# Patient Record
Sex: Female | Born: 1985
Health system: Southern US, Community
[De-identification: ages and names within clinical notes are randomized; demographics above are authoritative.]

## PROBLEM LIST (undated history)

## (undated) DIAGNOSIS — J302 Other seasonal allergic rhinitis: Secondary | ICD-10-CM

## (undated) DIAGNOSIS — F32A Depression, unspecified: Secondary | ICD-10-CM

## (undated) DIAGNOSIS — F329 Major depressive disorder, single episode, unspecified: Secondary | ICD-10-CM

## (undated) HISTORY — PX: PERINEAL LACERATION REPAIR: SHX5389

## (undated) HISTORY — DX: Other seasonal allergic rhinitis: J30.2

## (undated) HISTORY — DX: Depression, unspecified: F32.A

---
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
## 2006-06-04 HISTORY — PX: CHOLECYSTECTOMY: SHX55

## 2019-03-20 ENCOUNTER — Encounter: Payer: Self-pay | Admitting: Physician Assistant

## 2019-03-20 ENCOUNTER — Other Ambulatory Visit: Payer: Self-pay

## 2019-03-20 ENCOUNTER — Ambulatory Visit (INDEPENDENT_AMBULATORY_CARE_PROVIDER_SITE_OTHER): Payer: 59 | Admitting: Physician Assistant

## 2019-03-20 VITALS — BP 118/64 | HR 86 | Temp 98.2°F | Ht 64.0 in | Wt 166.0 lb

## 2019-03-20 DIAGNOSIS — F4323 Adjustment disorder with mixed anxiety and depressed mood: Secondary | ICD-10-CM

## 2019-03-20 NOTE — Patient Instructions (Signed)
It was great to meet you!  Start Zoloft 25 mg daily.   Let's follow-up in 2-4 weeks virtually, sooner if you have concerns.  Take care,  Inda Coke PA-C

## 2019-03-20 NOTE — Progress Notes (Signed)
Pamela Harper is a 33 y.o. female here for a new problem.  History of Present Illness:   Chief Complaint  Patient presents with  . Depression    HPI   Situational depression anxiety Patient reports that she has had intermittent issues in the past with anxiety and depression.  In 2013 she had a divorce and underwent therapy as well as took Prozac for about 6 months.  In 2016 she was on Celexa briefly for 6 months.  And then in 2017 she was on Zoloft for 2 months.  She has been off medication since 2017.  She had her first postpartum.  In May 2020, and since then she has noticed significant fluctuations in her mood.  She has worsening irritability, anxiety, and depressive thoughts the week before her period.  She states that with time she notices that the amount of time that she is having these thoughts is increasing.  And now states that there is about 2 weeks out of the month that she is having symptoms.  Denies suicidal or homicidal thoughts.  Her husband is a cardiologist, currently spending a lot of time at work, and she is having some issues with finding friends in the area.  She has 2 young children each 1 and 3.  She is condoms with her husband.  She is currently breast-feeding.  Depression screen PHQ 2/9 03/20/2019  Decreased Interest 1  Down, Depressed, Hopeless 2  PHQ - 2 Score 3  Altered sleeping 0  Tired, decreased energy 3  Change in appetite 2  Feeling bad or failure about yourself  2  Trouble concentrating 0  Moving slowly or fidgety/restless 0  Suicidal thoughts 0  PHQ-9 Score 10  Difficult doing work/chores Somewhat difficult     Past Medical History:  Diagnosis Date  . Depression   . Seasonal allergies      Social History   Socioeconomic History  . Marital status: Married    Spouse name: Not on file  . Number of children: Not on file  . Years of education: Not on file  . Highest education level: Not on file  Occupational History  . Not on file   Social Needs  . Financial resource strain: Not on file  . Food insecurity    Worry: Not on file    Inability: Not on file  . Transportation needs    Medical: Not on file    Non-medical: Not on file  Tobacco Use  . Smoking status: Never Smoker  . Smokeless tobacco: Never Used  Substance and Sexual Activity  . Alcohol use: Yes    Comment: Socially  . Drug use: Never  . Sexual activity: Not on file  Lifestyle  . Physical activity    Days per week: Not on file    Minutes per session: Not on file  . Stress: Not on file  Relationships  . Social Musician on phone: Not on file    Gets together: Not on file    Attends religious service: Not on file    Active member of club or organization: Not on file    Attends meetings of clubs or organizations: Not on file    Relationship status: Not on file  . Intimate partner violence    Fear of current or ex partner: Not on file    Emotionally abused: Not on file    Physically abused: Not on file    Forced sexual activity: Not on file  Other Topics Concern  . Not on file  Social History Narrative  . Not on file    Past Surgical History:  Procedure Laterality Date  . CESAREAN SECTION  2019  . CHOLECYSTECTOMY  2008    History reviewed. No pertinent family history.  Allergies  Allergen Reactions  . Codeine Hives    Current Medications:  No current outpatient medications on file.   Review of Systems:   ROS  Negative unless otherwise specified per HPI.   Vitals:   Vitals:   03/20/19 1445  BP: 118/64  Pulse: 86  Temp: 98.2 F (36.8 C)  SpO2: 99%  Weight: 166 lb (75.3 kg)  Height: 5\' 4"  (1.626 m)     Body mass index is 28.49 kg/m.  Physical Exam:   Physical Exam Vitals signs and nursing note reviewed.  Constitutional:      General: She is not in acute distress.    Appearance: She is well-developed. She is not ill-appearing or toxic-appearing.  Cardiovascular:     Rate and Rhythm: Normal rate and  regular rhythm.     Pulses: Normal pulses.     Heart sounds: Normal heart sounds, S1 normal and S2 normal.     Comments: No LE edema Pulmonary:     Effort: Pulmonary effort is normal.     Breath sounds: Normal breath sounds.  Skin:    General: Skin is warm and dry.  Neurological:     Mental Status: She is alert.     GCS: GCS eye subscore is 4. GCS verbal subscore is 5. GCS motor subscore is 6.  Psychiatric:        Attention and Perception: Attention normal.        Mood and Affect: Mood is anxious. Affect is tearful.        Speech: Speech normal.        Behavior: Behavior normal. Behavior is cooperative.     No results found for this or any previous visit.  Assessment and Plan:   [place] was seen today for depression.  Diagnoses and all orders for this visit:  Adjustment reaction with anxiety and depression Long discussion regarding this with patient.  We are going to start 25 mg Zoloft daily, due to breastfeeding status. Recommend that she tell her husband that she is starting this medication.  She does have an upcoming appointment with a new therapist in a few weeks.  Encouraged her to follow-up with me virtually in 2 to 4 weeks.  We will also check labs to rule out any organic cause of her worsening mood. I discussed with patient that if they develop any SI, to tell someone immediately and seek medical attention. -     CBC with Differential/Platelet -     Comprehensive metabolic panel -     TSH    . Reviewed expectations re: course of current medical issues. . Discussed self-management of symptoms. . Outlined signs and symptoms indicating need for more acute intervention. . Patient verbalized understanding and all questions were answered. . See orders for this visit as documented in the electronic medical record. . Patient received an After-Visit Summary.  I spent 45 minutes with this patient, greater than 50% was face-to-face time counseling regarding the above  diagnoses.   Inda Coke, PA-C

## 2019-03-20 NOTE — Addendum Note (Signed)
Addended by: Francis Dowse T on: 03/20/2019 03:57 PM   Modules accepted: Orders

## 2019-03-20 NOTE — Addendum Note (Signed)
Addended by: Francis Dowse T on: 03/20/2019 03:50 PM   Modules accepted: Orders

## 2019-03-20 NOTE — Addendum Note (Signed)
Addended by: Francis Dowse T on: 03/20/2019 03:56 PM   Modules accepted: Orders

## 2019-03-20 NOTE — Addendum Note (Signed)
Addended by: Francis Dowse T on: 03/20/2019 03:51 PM   Modules accepted: Orders

## 2019-03-24 ENCOUNTER — Other Ambulatory Visit: Payer: 59

## 2019-03-26 ENCOUNTER — Other Ambulatory Visit: Payer: Self-pay | Admitting: *Deleted

## 2019-03-26 ENCOUNTER — Encounter: Payer: Self-pay | Admitting: Physician Assistant

## 2019-03-26 MED ORDER — SERTRALINE HCL 25 MG PO TABS: 25.0000 mg | ORAL_TABLET | Freq: Every day | ORAL | 0 refills | Status: DC

## 2019-04-29 DIAGNOSIS — F4323 Adjustment disorder with mixed anxiety and depressed mood: Secondary | ICD-10-CM | POA: Diagnosis not present

## 2019-05-07 DIAGNOSIS — F4323 Adjustment disorder with mixed anxiety and depressed mood: Secondary | ICD-10-CM | POA: Diagnosis not present

## 2019-05-19 DIAGNOSIS — F4323 Adjustment disorder with mixed anxiety and depressed mood: Secondary | ICD-10-CM | POA: Diagnosis not present

## 2019-06-09 DIAGNOSIS — F4323 Adjustment disorder with mixed anxiety and depressed mood: Secondary | ICD-10-CM | POA: Diagnosis not present

## 2019-06-23 DIAGNOSIS — F4323 Adjustment disorder with mixed anxiety and depressed mood: Secondary | ICD-10-CM | POA: Diagnosis not present

## 2019-07-07 DIAGNOSIS — F4323 Adjustment disorder with mixed anxiety and depressed mood: Secondary | ICD-10-CM | POA: Diagnosis not present

## 2019-07-21 DIAGNOSIS — F4323 Adjustment disorder with mixed anxiety and depressed mood: Secondary | ICD-10-CM | POA: Diagnosis not present

## 2019-08-05 DIAGNOSIS — F4323 Adjustment disorder with mixed anxiety and depressed mood: Secondary | ICD-10-CM | POA: Diagnosis not present

## 2019-09-01 DIAGNOSIS — F4323 Adjustment disorder with mixed anxiety and depressed mood: Secondary | ICD-10-CM | POA: Diagnosis not present

## 2019-09-09 DIAGNOSIS — Z23 Encounter for immunization: Secondary | ICD-10-CM | POA: Diagnosis not present

## 2019-09-18 ENCOUNTER — Encounter: Payer: Self-pay | Admitting: Family Medicine

## 2019-09-18 ENCOUNTER — Ambulatory Visit (INDEPENDENT_AMBULATORY_CARE_PROVIDER_SITE_OTHER): Payer: 59 | Admitting: Family Medicine

## 2019-09-18 ENCOUNTER — Other Ambulatory Visit (HOSPITAL_COMMUNITY)
Admission: RE | Admit: 2019-09-18 | Discharge: 2019-09-18 | Disposition: A | Discharge status: Home / Self Care | Payer: 59 | Source: Ambulatory Visit | Attending: Family Medicine | Admitting: Family Medicine

## 2019-09-18 ENCOUNTER — Other Ambulatory Visit: Payer: Self-pay

## 2019-09-18 VITALS — BP 110/70 | HR 87 | Temp 98.4°F | Ht 64.0 in | Wt 172.6 lb

## 2019-09-18 DIAGNOSIS — R350 Frequency of micturition: Secondary | ICD-10-CM | POA: Diagnosis not present

## 2019-09-18 LAB — POC URINALSYSI DIPSTICK (AUTOMATED)
Bilirubin, UA: NEGATIVE
Blood, UA: NEGATIVE
Glucose, UA: NEGATIVE
Ketones, UA: NEGATIVE
Leukocytes, UA: NEGATIVE
Nitrite, UA: NEGATIVE
Protein, UA: NEGATIVE
Spec Grav, UA: 1.01 (ref 1.010–1.025)
Urobilinogen, UA: 0.2 E.U./dL
pH, UA: 6 (ref 5.0–8.0)

## 2019-09-18 LAB — POCT URINE PREGNANCY: Preg Test, Ur: NEGATIVE

## 2019-09-18 MED ORDER — CEPHALEXIN 500 MG PO CAPS: 500.0000 mg | ORAL_CAPSULE | Freq: Two times a day (BID) | ORAL | 0 refills | Status: AC

## 2019-09-18 NOTE — Patient Instructions (Addendum)
It was very nice to see you today!  We will check a urine culture and a swab to make sure that you do not have any signs of infection.  It is possible that your symptoms could be related to your previous pelvic floor issues.  Please let us know if your symptoms change or if you start to develop any sort of fevers or chills.  Please start the antibiotics if you develop any signs of a kidney infection.  Please make sure that you get plenty of fluids and stay well-hydrated.  Take care, Dr Jimmey Ralph  Please try these tips to maintain a healthy lifestyle:   Eat at least 3 REAL meals and 1-2 snacks per day.  Aim for no more than 5 hours between eating.  If you eat breakfast, please do so within one hour of getting up.    Each meal should contain half fruits/vegetables, one quarter protein, and one quarter carbs (no bigger than a computer mouse)   Cut down on sweet beverages. This includes juice, soda, and sweet tea.     Drink at least 1 glass of water with each meal and aim for at least 8 glasses per day   Exercise at least 150 minutes every week.

## 2019-09-18 NOTE — Progress Notes (Signed)
   Pamela Harper is a 34 y.o. female who presents today for an office visit.  Assessment/Plan:  New/Acute Problems: Urinary Urgency UA negative.  Urine pregnancy negative normal-appearing gynecologic exam.  Urine culture and GC/CT/BV/Candida test pending.  Has a history of pyelonephritis in the past.  In light of this we will send in "pocket prescription" for Keflex to not start unless symptoms worsen over the weekend.  Encourage good oral hydration.  If above tests are negative and symptoms persist will likely need ultrasound to rule out fibroids, ovarian cyst, etc.  Discussed reasons to return to care.      Subjective:  HPI:  Patient here with lower abdominal pain for the past week or so.  Has had more discomfort more bloating.  She has increased urinary urgency.  No dysuria.  Some tenderness with pressing on the area.  No fevers.  No chills.  No back pain.  No vaginal discharge.  No bloody urine.  Took 2 home pregnancy tests which were negative.  No issues with constipation.       Objective:  Physical Exam: There were no vitals taken for this visit.  Gen: No acute distress, resting comfortably CV: Regular rate and rhythm with no murmurs appreciated Pulm: Normal work of breathing, clear to auscultation bilaterally with no crackles, wheezes, or rhonchi GU: Normal internal and external female genitalia.  Cervix observed without abnormality.  Bimanual exam with no significant abnormalities aside from mild tenderness along left adnexal area and suprapubic area. Neuro: Grossly normal, moves all extremities Psych: Normal affect and thought content  Time Spent: 41 minutes of total time was spent on the date of the encounter performing the following actions: chart review prior to seeing the patient, obtaining history, performing a medically necessary exam, counseling on the treatment plan, placing orders, and documenting in our EHR.        Katina Degree. Jimmey Ralph, MD 09/18/2019 3:12 PM

## 2019-09-19 LAB — URINE CULTURE
MICRO NUMBER:: 10373120
SPECIMEN QUALITY:: ADEQUATE

## 2019-09-21 NOTE — Progress Notes (Signed)
Please inform patient of the following:  Urine culture shows no sign of UTI. We are still waiting on her other swab to come back, though I expect for it to be normal. Would like for her to let us know if she is still having symptoms and if so we will need to get an ultrasound.

## 2019-09-22 LAB — CERVICOVAGINAL ANCILLARY ONLY
Bacterial Vaginitis (gardnerella): NEGATIVE
Candida Glabrata: NEGATIVE
Candida Vaginitis: NEGATIVE
Chlamydia: NEGATIVE
Comment: NEGATIVE
Comment: NEGATIVE
Comment: NEGATIVE
Comment: NEGATIVE
Comment: NORMAL
Neisseria Gonorrhea: NEGATIVE

## 2019-09-22 NOTE — Progress Notes (Signed)
Please inform patient of the following:  Swab negative for infection. Would like for her to let us know if symptoms are still persisting and we can order an ultrasound.  Katina Degree. Jimmey Ralph, MD 09/22/2019 1:00 PM

## 2019-09-24 ENCOUNTER — Other Ambulatory Visit: Payer: Self-pay | Admitting: *Deleted

## 2019-09-24 DIAGNOSIS — R350 Frequency of micturition: Secondary | ICD-10-CM

## 2019-09-24 DIAGNOSIS — R102 Pelvic and perineal pain: Secondary | ICD-10-CM

## 2019-09-28 DIAGNOSIS — F4323 Adjustment disorder with mixed anxiety and depressed mood: Secondary | ICD-10-CM | POA: Diagnosis not present

## 2019-10-02 ENCOUNTER — Ambulatory Visit
Admission: RE | Admit: 2019-10-02 | Discharge: 2019-10-02 | Disposition: A | Discharge status: Home / Self Care | Payer: 59 | Source: Ambulatory Visit | Attending: Family Medicine | Admitting: Family Medicine

## 2019-10-02 DIAGNOSIS — R102 Pelvic and perineal pain: Secondary | ICD-10-CM | POA: Diagnosis not present

## 2019-10-05 NOTE — Progress Notes (Signed)
Please inform patient of the following:  Her ultrasound showed no fibroids or other significant abnormalities. It is possible that her scar from her c section could be causing her symptoms. Recommend referral to GYN or pelvic floor rehab if she is still having symptoms.  Pamela Harper. Jimmey Ralph, MD 10/05/2019 12:47 PM

## 2019-11-23 DIAGNOSIS — H5203 Hypermetropia, bilateral: Secondary | ICD-10-CM | POA: Diagnosis not present

## 2019-11-24 ENCOUNTER — Ambulatory Visit (INDEPENDENT_AMBULATORY_CARE_PROVIDER_SITE_OTHER): Payer: 59 | Admitting: Physician Assistant

## 2019-11-24 ENCOUNTER — Encounter: Payer: Self-pay | Admitting: Physician Assistant

## 2019-11-24 ENCOUNTER — Other Ambulatory Visit: Payer: Self-pay

## 2019-11-24 VITALS — BP 110/68 | HR 68 | Temp 98.0°F | Ht 64.0 in | Wt 167.6 lb

## 2019-11-24 DIAGNOSIS — R5383 Other fatigue: Secondary | ICD-10-CM | POA: Diagnosis not present

## 2019-11-24 DIAGNOSIS — F4323 Adjustment disorder with mixed anxiety and depressed mood: Secondary | ICD-10-CM | POA: Diagnosis not present

## 2019-11-24 DIAGNOSIS — Z13220 Encounter for screening for lipoid disorders: Secondary | ICD-10-CM | POA: Diagnosis not present

## 2019-11-24 DIAGNOSIS — E663 Overweight: Secondary | ICD-10-CM | POA: Diagnosis not present

## 2019-11-24 DIAGNOSIS — Z136 Encounter for screening for cardiovascular disorders: Secondary | ICD-10-CM

## 2019-11-24 DIAGNOSIS — Z0001 Encounter for general adult medical examination with abnormal findings: Secondary | ICD-10-CM

## 2019-11-24 LAB — COMPREHENSIVE METABOLIC PANEL
ALT: 16 U/L (ref 0–35)
AST: 16 U/L (ref 0–37)
Albumin: 5.2 g/dL (ref 3.5–5.2)
Alkaline Phosphatase: 59 U/L (ref 39–117)
BUN: 16 mg/dL (ref 6–23)
CO2: 27 mEq/L (ref 19–32)
Calcium: 9.7 mg/dL (ref 8.4–10.5)
Chloride: 102 mEq/L (ref 96–112)
Creatinine, Ser: 0.85 mg/dL (ref 0.40–1.20)
GFR: 75.99 mL/min (ref >60.00)
Glucose, Bld: 94 mg/dL (ref 70–99)
Potassium: 4.3 mEq/L (ref 3.5–5.1)
Sodium: 136 mEq/L (ref 135–145)
Total Bilirubin: 0.6 mg/dL (ref 0.2–1.2)
Total Protein: 8.2 g/dL (ref 6.0–8.3)

## 2019-11-24 LAB — LIPID PANEL
Cholesterol: 138 mg/dL (ref 0–200)
HDL: 59.6 mg/dL (ref >39.00)
LDL Cholesterol: 71 mg/dL (ref 0–99)
NonHDL: 78.5
Total CHOL/HDL Ratio: 2
Triglycerides: 36 mg/dL (ref 0.0–149.0)
VLDL: 7.2 mg/dL (ref 0.0–40.0)

## 2019-11-24 LAB — CBC WITH DIFFERENTIAL/PLATELET
Basophils Absolute: 0 10*3/uL (ref 0.0–0.1)
Basophils Relative: 0.5 % (ref 0.0–3.0)
Eosinophils Absolute: 0.2 10*3/uL (ref 0.0–0.7)
Eosinophils Relative: 2.1 % (ref 0.0–5.0)
HCT: 41 % (ref 36.0–46.0)
Hemoglobin: 13.7 g/dL (ref 12.0–15.0)
Lymphocytes Relative: 40.6 % (ref 12.0–46.0)
Lymphs Abs: 3.5 10*3/uL (ref 0.7–4.0)
MCHC: 33.4 g/dL (ref 30.0–36.0)
MCV: 81.1 fl (ref 78.0–100.0)
Monocytes Absolute: 0.4 10*3/uL (ref 0.1–1.0)
Monocytes Relative: 4.2 % (ref 3.0–12.0)
Neutro Abs: 4.5 10*3/uL (ref 1.4–7.7)
Neutrophils Relative %: 52.6 % (ref 43.0–77.0)
Platelets: 294 10*3/uL (ref 150.0–400.0)
RBC: 5.05 Mil/uL (ref 3.87–5.11)
RDW: 13.1 % (ref 11.5–15.5)
WBC: 8.5 10*3/uL (ref 4.0–10.5)

## 2019-11-24 LAB — VITAMIN D 25 HYDROXY (VIT D DEFICIENCY, FRACTURES): VITD: 37.84 ng/mL (ref 30.00–100.00)

## 2019-11-24 LAB — VITAMIN B12: Vitamin B-12: 409 pg/mL (ref 211–911)

## 2019-11-24 LAB — TSH: TSH: 1.63 u[IU]/mL (ref 0.35–4.50)

## 2019-11-24 NOTE — Progress Notes (Signed)
I acted as a Neurosurgeon for [name], PA-C [place], [place]  Subjective:    Pamela Harper is a 34 y.o. female and is here for a comprehensive physical exam.  HPI   Patient is fasting. No new concerns.  Health Maintenance Due  Topic Date Due  . Hepatitis C Screening  Never done  . HIV Screening  Never done    Acute Concerns: Fatigue -- drinking 1-3 cups daily, working on getting back into activity with her Pel[employer] bike. Sleep is adequate but sometimes she admits to staying up too late. Has good nutrition overall. Denies significant weight gain/loss. Breastfeeding once/day. Has two young children.   Chronic Issues: Adjustment reaction with anxiety/depression -- never started zoloft. Doing well with therapy.   Health Maintenance: Immunizations -- Up to date Colonoscopy -- N/A Mammogram --N/A PAP -- Up to date. Last done 10/2018 Bone Density -- N/A Diet -- overall health Caffeine intake -- coffee -- 1-3 cups daily Sleep habits -- sometimes staying awake for "me time" Exercise -- using Pel[employer] Current Weight -- Weight: 167 lb 9.6 oz (76 kg) ; would like to be closer to 150 lb Weight History: Wt Readings from Last 10 Encounters:  11/24/19 167 lb 9.6 oz (76 kg)  09/18/19 172 lb 9.6 oz (78.3 kg)  03/20/19 166 lb (75.3 kg)   Body mass index is 28.77 kg/m. Mood -- overall good Patient's last menstrual period was 10/28/2019 (exact date). Alcohol -- 0-2 drinks/week  Depression screen PHQ 2/9 03/20/2019  Decreased Interest 1  Down, Depressed, Hopeless 2  PHQ - 2 Score 3  Altered sleeping 0  Tired, decreased energy 3  Change in appetite 2  Feeling bad or failure about yourself  2  Trouble concentrating 0  Moving slowly or fidgety/restless 0  Suicidal thoughts 0  PHQ-9 Score 10  Difficult doing work/chores Somewhat difficult   Eye doctor [facility] Dentist pending  Other providers/specialists: Patient Care Team: [name], [place] as PCP - General (Physician  Assistant)   PMHx, SurgHx, SocialHx, Medications, and Allergies were reviewed in the Visit Navigator and updated as appropriate.   Past Medical History:  Diagnosis Date  . Depression   . Seasonal allergies      Past Surgical History:  Procedure Laterality Date  . CESAREAN SECTION  2019  . CHOLECYSTECTOMY  2008     Family History  Problem Relation Age of Onset  . Diabetes Mother        prediabetes  . Sleep apnea Mother   . Bipolar disorder Mother   . Anxiety disorder Mother   . Depression Mother   . Asthma Mother   . Macular degeneration Mother   . Hypothyroidism Mother   . Hypertension Father   . Multiple sclerosis Father   . Depression Father   . Sleep apnea Father   . Hyperthyroidism Father   . Anxiety disorder Sister   . Macular degeneration Maternal Grandfather   . Diabetes Paternal Grandmother   . Hyperlipidemia Paternal Grandmother   . Hypertension Paternal Grandmother   . Cancer Paternal Grandfather     Social History   Tobacco Use  . Smoking status: Never Smoker  . Smokeless tobacco: Never Used  Substance Use Topics  . Alcohol use: Yes    Comment: Socially  . Drug use: Never    Review of Systems:   Review of Systems  Constitutional: Negative.  Negative for chills, fever, malaise/fatigue and weight loss.  HENT: Negative.  Negative for hearing loss,  sinus pain and sore throat.   Eyes: Negative.   Respiratory: Negative.  Negative for cough and hemoptysis.   Cardiovascular: Negative.  Negative for chest pain, palpitations, leg swelling and PND.  Gastrointestinal: Negative.  Negative for abdominal pain, constipation, diarrhea, heartburn, nausea and vomiting.  Genitourinary: Negative.  Negative for dysuria, frequency and urgency.  Musculoskeletal: Negative.  Negative for back pain, myalgias and neck pain.  Skin: Negative.  Negative for itching and rash.  Neurological: Negative.  Negative for dizziness, tingling, seizures and headaches.    Endo/Heme/Allergies: Negative for polydipsia.  Psychiatric/Behavioral: Negative.  Negative for depression. The patient is not nervous/anxious.     Objective:   BP 110/68 (BP Location: Right Arm, Patient Position: Sitting, Cuff Size: Normal)   Pulse 68   Temp 98 F (36.7 C) (Temporal)   Ht 5\' 4"  (1.626 m)   Wt 167 lb 9.6 oz (76 kg)   LMP 10/28/2019 (Exact Date)   SpO2 99%   Breastfeeding Yes   BMI 28.77 kg/m   General Appearance:    Alert, cooperative, no distress, appears stated age  Head:    Normocephalic, without obvious abnormality, atraumatic  Eyes:    PERRL, conjunctiva/corneas clear, EOM's intact, fundi    benign, both eyes  Ears:    Normal TM's and external ear canals, both ears  Nose:   Nares normal, septum midline, mucosa normal, no drainage    or sinus tenderness  Throat:   Lips, mucosa, and tongue normal; teeth and gums normal  Neck:   Supple, symmetrical, trachea midline, no adenopathy;    thyroid:  no enlargement/tenderness/nodules; no carotid   bruit or JVD  Back:     Symmetric, no curvature, ROM normal, no CVA tenderness  Lungs:     Clear to auscultation bilaterally, respirations unlabored  Chest Wall:    No tenderness or deformity   Heart:    Regular rate and rhythm, S1 and S2 normal, no murmur, rub   or gallop  Breast Exam:    No tenderness, masses, or nipple abnormality  Abdomen:     Soft, non-tender, bowel sounds active all four quadrants,    no masses, no organomegaly  Genitalia:    Normal female without lesion, discharge or tenderness  Rectal:    Normal tone no masses or tenderness  Extremities:   Extremities normal, atraumatic, no cyanosis or edema  Pulses:   2+ and symmetric all extremities  Skin:   Skin color, texture, turgor normal, no rashes or lesions  Lymph nodes:   Cervical, supraclavicular, and axillary nodes normal  Neurologic:   CNII-XII intact, normal strength, sensation and reflexes    throughout    Assessment/Plan:   Pamela Harper was  seen today for annual exam.  Diagnoses and all orders for this visit:  Encounter for general adult medical examination with abnormal findings Today patient counseled on age appropriate routine health concerns for screening and prevention, each reviewed and up to date or declined. Immunizations reviewed and up to date or declined. Labs ordered and reviewed. Risk factors for depression reviewed and negative. Hearing function and visual acuity are intact. ADLs screened and addressed as needed. Functional ability and level of safety reviewed and appropriate. Education, counseling and referrals performed based on assessed risks today. Patient provided with a copy of personalized plan for preventive services.  Fatigue, unspecified type Continue to work on healthy lifestyle and more time for herself as able. Will update labs to assess for any organic cause of symptoms. Recommendations based  upon results and clinical symptoms. -     Comprehensive metabolic panel -     TSH -     CBC with Differential/Platelet -     Vitamin B12 -     VITAMIN D 25 Hydroxy (Vit-D Deficiency, Fractures)  Overweight Continue exercise and healthy eating.  Adjustment reaction with anxiety and depression Doing well, continue to monitor.  Encounter for lipid screening for cardiovascular disease -     Lipid panel    Well Adult Exam: Labs ordered: Yes. Patient counseling was done. See below for items discussed. Discussed the patient's BMI. The BMI is not in the acceptable range; BMI management plan is completed Follow up in one year.  Patient Counseling:   [x]     Nutrition: Stressed importance of moderation in sodium/caffeine intake, saturated fat and cholesterol, caloric balance, sufficient intake of fresh fruits, vegetables, fiber, calcium, iron, and 1 mg of folate supplement per day (for females capable of pregnancy).   [x]      Stressed the importance of regular exercise.    [x]     Substance Abuse: Discussed  cessation/primary prevention of tobacco, alcohol, or other drug use; driving or other dangerous activities under the influence; availability of treatment for abuse.    [x]      Injury prevention: Discussed safety belts, safety helmets, smoke detector, smoking near bedding or upholstery.    [x]      Sexuality: Discussed sexually transmitted diseases, partner selection, use of condoms, avoidance of unintended pregnancy  and contraceptive alternatives.    [x]     Dental health: Discussed importance of regular tooth brushing, flossing, and dental visits.   [x]      Health maintenance and immunizations reviewed. Please refer to Health maintenance section.   CMA or LPN served as scribe during this visit. History, Physical, and Plan performed by medical provider. The above documentation has been reviewed and is accurate and complete.  Inda Coke, PA-C [place]

## 2019-11-24 NOTE — Patient Instructions (Signed)
It was great to see you!  Please go to the lab for blood work.   Our office will call you with your results unless you have chosen to receive results via MyChart.  If your blood work is normal we will follow-up each year for physicals and as scheduled for chronic medical problems.  If anything is abnormal we will treat accordingly and get you in for a follow-up.  Take care,  Pamela Harper    Health Maintenance, Female Adopting a healthy lifestyle and getting preventive care are important in promoting health and wellness. Ask your health care provider about:  The right schedule for you to have regular tests and exams.  Things you can do on your own to prevent diseases and keep yourself healthy. What should I know about diet, weight, and exercise? Eat a healthy diet   Eat a diet that includes plenty of vegetables, fruits, low-fat dairy products, and lean protein.  Do not eat a lot of foods that are high in solid fats, added sugars, or sodium. Maintain a healthy weight Body mass index (BMI) is used to identify weight problems. It estimates body fat based on height and weight. Your health care provider can help determine your BMI and help you achieve or maintain a healthy weight. Get regular exercise Get regular exercise. This is one of the most important things you can do for your health. Most adults should:  Exercise for at least 150 minutes each week. The exercise should increase your heart rate and make you sweat (moderate-intensity exercise).  Do strengthening exercises at least twice a week. This is in addition to the moderate-intensity exercise.  Spend less time sitting. Even light physical activity can be beneficial. Watch cholesterol and blood lipids Have your blood tested for lipids and cholesterol at 35 years of age, then have this test every 5 years. Have your cholesterol levels checked more often if:  Your lipid or cholesterol levels are high.  You are older than 35  years of age.  You are at high risk for heart disease. What should I know about cancer screening? Depending on your health history and family history, you may need to have cancer screening at various ages. This may include screening for:  Breast cancer.  Cervical cancer.  Colorectal cancer.  Skin cancer.  Lung cancer. What should I know about heart disease, diabetes, and high blood pressure? Blood pressure and heart disease  High blood pressure causes heart disease and increases the risk of stroke. This is more likely to develop in people who have high blood pressure readings, are of African descent, or are overweight.  Have your blood pressure checked: ? Every 3-5 years if you are 18-39 years of age. ? Every year if you are 40 years old or older. Diabetes Have regular diabetes screenings. This checks your fasting blood sugar level. Have the screening done:  Once every three years after age 40 if you are at a normal weight and have a low risk for diabetes.  More often and at a younger age if you are overweight or have a high risk for diabetes. What should I know about preventing infection? Hepatitis B If you have a higher risk for hepatitis B, you should be screened for this virus. Talk with your health care provider to find out if you are at risk for hepatitis B infection. Hepatitis C Testing is recommended for:  Everyone born from 1945 through 1965.  Anyone with known risk factors for hepatitis C. Sexually   transmitted infections (STIs)  Get screened for STIs, including gonorrhea and chlamydia, if: ? You are sexually active and are younger than 35 years of age. ? You are older than 35 years of age and your health care provider tells you that you are at risk for this type of infection. ? Your sexual activity has changed since you were last screened, and you are at increased risk for chlamydia or gonorrhea. Ask your health care provider if you are at risk.  Ask your health  care provider about whether you are at high risk for HIV. Your health care provider may recommend a prescription medicine to help prevent HIV infection. If you choose to take medicine to prevent HIV, you should first get tested for HIV. You should then be tested every 3 months for as long as you are taking the medicine. Pregnancy  If you are about to stop having your period (premenopausal) and you may become pregnant, seek counseling before you get pregnant.  Take 400 to 800 micrograms (mcg) of folic acid every day if you become pregnant.  Ask for birth control (contraception) if you want to prevent pregnancy. Osteoporosis and menopause Osteoporosis is a disease in which the bones lose minerals and strength with aging. This can result in bone fractures. If you are 65 years old or older, or if you are at risk for osteoporosis and fractures, ask your health care provider if you should:  Be screened for bone loss.  Take a calcium or vitamin D supplement to lower your risk of fractures.  Be given hormone replacement therapy (HRT) to treat symptoms of menopause. Follow these instructions at home: Lifestyle  Do not use any products that contain nicotine or tobacco, such as cigarettes, e-cigarettes, and chewing tobacco. If you need help quitting, ask your health care provider.  Do not use street drugs.  Do not share needles.  Ask your health care provider for help if you need support or information about quitting drugs. Alcohol use  Do not drink alcohol if: ? Your health care provider tells you not to drink. ? You are pregnant, may be pregnant, or are planning to become pregnant.  If you drink alcohol: ? Limit how much you use to 0-1 drink a day. ? Limit intake if you are breastfeeding.  Be aware of how much alcohol is in your drink. In the [place] one drink equals one 12 oz bottle of beer (355 mL), one 5 oz glass of wine (148 mL), or one 1 oz glass of hard liquor (44 mL). General  instructions  Schedule regular health, dental, and eye exams.  Stay current with your vaccines.  Tell your health care provider if: ? You often feel depressed. ? You have ever been abused or do not feel safe at home. Summary  Adopting a healthy lifestyle and getting preventive care are important in promoting health and wellness.  Follow your health care provider's instructions about healthy diet, exercising, and getting tested or screened for diseases.  Follow your health care provider's instructions on monitoring your cholesterol and blood pressure. This information is not intended to replace advice given to you by your health care provider. Make sure you discuss any questions you have with your health care provider. Document Revised: 05/14/2018 Document Reviewed: 05/14/2018 Elsevier Patient Education  2020 Elsevier Inc.  

## 2020-01-25 ENCOUNTER — Ambulatory Visit: Payer: 59 | Admitting: Physician Assistant

## 2020-01-25 ENCOUNTER — Encounter: Payer: Self-pay | Admitting: Physician Assistant

## 2020-01-25 ENCOUNTER — Other Ambulatory Visit: Payer: Self-pay

## 2020-01-25 VITALS — BP 110/80 | HR 84 | Temp 98.1°F | Ht 64.0 in | Wt 165.0 lb

## 2020-01-25 DIAGNOSIS — F4323 Adjustment disorder with mixed anxiety and depressed mood: Secondary | ICD-10-CM | POA: Diagnosis not present

## 2020-01-25 MED ORDER — ONDANSETRON HCL 4 MG PO TABS: 4.0000 mg | ORAL_TABLET | Freq: Three times a day (TID) | ORAL | 1 refills | Status: DC | PRN

## 2020-01-25 NOTE — Patient Instructions (Addendum)
It was great to see you!  Start Trintellix 5 mg daily (this is HALF) a tablet. I have given you enough for 4 weeks. Please let me know about two weeks in if you want to continue this and I can prescribe.  Zofran as needed.  Follow-up with me in 6 weeks.  Take care,  [name] PA-C

## 2020-01-25 NOTE — Progress Notes (Signed)
Pamela Harper is a 34 y.o. female is here for follow up.  I acted as a Neurosurgeon for [name], PA-C [employer], LPN   History of Present Illness:   Chief Complaint  Patient presents with   Anxiety   Depression    HPI   Anxiety/Depression Pt is here for follow up, she never started the medication Zoloft. Has seen a therapist which dd help but last time was in May. Pt says she has a lot going on, under a lot of stress with kids and move. Pt is starting to see a new therapist in Sept. 2nd.  Stopped breastfeeding about a month ago and feels like her mood is worse.  She is having significant issues with thinking about plumbing in her house going wrong or other catastrophic issues with her house.  Was on Prozac and Celexa in the past and the both caused significant nausea.  Wt Readings from Last 5 Encounters:  01/25/20 165 lb (74.8 kg)  11/24/19 167 lb 9.6 oz (76 kg)  09/18/19 172 lb 9.6 oz (78.3 kg)  03/20/19 166 lb (75.3 kg)   Denies SI/HI.  GAD 7 : Generalized Anxiety Score 01/25/2020  Nervous, Anxious, on Edge 3  Control/stop worrying 2  Worry too much - different things 2  Trouble relaxing 3  Restless 1  Easily annoyed or irritable 3  Afraid - awful might happen 3  Total GAD 7 Score 17  Anxiety Difficulty Somewhat difficult    Depression screen [facility] 2/9 01/25/2020 03/20/2019  Decreased Interest 1 1  Down, Depressed, Hopeless 2 2  PHQ - 2 Score 3 3  Altered sleeping 2 0  Tired, decreased energy 3 3  Change in appetite 3 2  Feeling bad or failure about yourself  2 2  Trouble concentrating 1 0  Moving slowly or fidgety/restless 1 0  Suicidal thoughts 0 0  PHQ-9 Score 15 10  Difficult doing work/chores Somewhat difficult Somewhat difficult      Health Maintenance Due  Topic Date Due   Hepatitis C Screening  Never done   HIV Screening  Never done    Past Medical History:  Diagnosis Date   Depression    Seasonal allergies        Social History   Tobacco Use   Smoking status: Never Smoker   Smokeless tobacco: Never Used  Substance Use Topics   Alcohol use: Yes    Comment: Socially   Drug use: Never    Past Surgical History:  Procedure Laterality Date   CESAREAN SECTION  2019   CHOLECYSTECTOMY  2008    Family History  Problem Relation Age of Onset   Diabetes Mother        prediabetes   Sleep apnea Mother    Bipolar disorder Mother    Anxiety disorder Mother    Depression Mother    Asthma Mother    Macular degeneration Mother    Hypothyroidism Mother    Hypertension Father    Multiple sclerosis Father    Depression Father    Sleep apnea Father    Hyperthyroidism Father    Anxiety disorder Sister    Macular degeneration Maternal Grandfather    Diabetes Paternal Grandmother    Hyperlipidemia Paternal Grandmother    Hypertension Paternal Grandmother    Cancer Paternal Grandfather     PMHx, SurgHx, SocialHx, FamHx, Medications, and Allergies were reviewed in the Visit Navigator and updated as appropriate.   There are no problems to  display for this patient.   Social History   Tobacco Use   Smoking status: Never Smoker   Smokeless tobacco: Never Used  Substance Use Topics   Alcohol use: Yes    Comment: Socially   Drug use: Never    Current Medications and Allergies:    Current Outpatient Medications:    ondansetron (ZOFRAN) 4 MG tablet, Take 1 tablet (4 mg total) by mouth every 8 (eight) hours as needed for nausea or vomiting., Disp: 30 tablet, Rfl: 1   sertraline (ZOLOFT) 25 MG tablet, Take 1 tablet (25 mg total) by mouth daily. (Patient not taking: Reported on 09/18/2019), Disp: 30 tablet, Rfl: 0   Allergies  Allergen Reactions   Codeine Hives    Review of Systems   ROS Negative unless otherwise specified per HPI.  Vitals:   Vitals:   01/25/20 1339  BP: 110/80  Pulse: 84  Temp: 98.1 F (36.7 C)  TempSrc: Temporal  SpO2: 98%   Weight: 165 lb (74.8 kg)  Height: 5\' 4"  (1.626 m)     Body mass index is 28.32 kg/m.   Physical Exam:    Physical Exam Vitals and nursing note reviewed.  Constitutional:      General: She is not in acute distress.    Appearance: She is well-developed. She is not ill-appearing or toxic-appearing.  Cardiovascular:     Rate and Rhythm: Normal rate and regular rhythm.     Pulses: Normal pulses.     Heart sounds: Normal heart sounds, S1 normal and S2 normal.     Comments: No LE edema Pulmonary:     Effort: Pulmonary effort is normal.     Breath sounds: Normal breath sounds.  Skin:    General: Skin is warm and dry.  Neurological:     Mental Status: She is alert.     GCS: GCS eye subscore is 4. GCS verbal subscore is 5. GCS motor subscore is 6.  Psychiatric:        Speech: Speech normal.        Behavior: Behavior normal. Behavior is cooperative.      Assessment and Plan:    Stormey was seen today for anxiety and depression.  Diagnoses and all orders for this visit:  Adjustment reaction with anxiety and depression  Other orders -     ondansetron (ZOFRAN) 4 MG tablet; Take 1 tablet (4 mg total) by mouth every 8 (eight) hours as needed for nausea or vomiting.   Uncontrolled. Start Trintellix 5 mg daily. I gave her enough samples for 1 month and have asked to her to let me know if she would like this prescribed after 2-3 weeks of taking samples. Continue therapy. Follow-up in 6 weeks, sooner if concerns.   Reviewed expectations re: course of current medical issues.  Discussed self-management of symptoms.  Outlined signs and symptoms indicating need for more acute intervention.  Patient verbalized understanding and all questions were answered.  See orders for this visit as documented in the electronic medical record.  Patient received an After Visit Summary.  CMA or LPN served as scribe during this visit. History, Physical, and Plan performed by medical  provider. The above documentation has been reviewed and is accurate and complete.  Time spent with patient today was 25 minutes which consisted of chart review, discussing diagnosis, work up, treatment answering questions and documentation.   Lanora Manis, PA-C [place], [place] 01/25/2020  Follow-up: No follow-ups on file.

## 2020-01-28 ENCOUNTER — Other Ambulatory Visit: Payer: Self-pay | Admitting: Physician Assistant

## 2020-01-28 ENCOUNTER — Encounter: Payer: Self-pay | Admitting: Physician Assistant

## 2020-01-28 MED ORDER — SERTRALINE HCL 25 MG PO TABS: 25.0000 mg | ORAL_TABLET | Freq: Every day | ORAL | 1 refills | Status: DC

## 2020-02-23 DIAGNOSIS — M25512 Pain in left shoulder: Secondary | ICD-10-CM | POA: Diagnosis not present

## 2020-02-29 ENCOUNTER — Other Ambulatory Visit: Payer: Self-pay

## 2020-02-29 ENCOUNTER — Other Ambulatory Visit (HOSPITAL_COMMUNITY): Payer: Self-pay | Admitting: Physician Assistant

## 2020-02-29 ENCOUNTER — Ambulatory Visit (INDEPENDENT_AMBULATORY_CARE_PROVIDER_SITE_OTHER): Payer: 59 | Admitting: Physician Assistant

## 2020-02-29 ENCOUNTER — Encounter: Payer: Self-pay | Admitting: Physician Assistant

## 2020-02-29 VITALS — BP 130/86 | HR 76 | Temp 98.0°F | Ht 64.0 in | Wt 164.0 lb

## 2020-02-29 DIAGNOSIS — F4323 Adjustment disorder with mixed anxiety and depressed mood: Secondary | ICD-10-CM | POA: Diagnosis not present

## 2020-02-29 MED ORDER — SERTRALINE HCL 50 MG PO TABS: 50.0000 mg | ORAL_TABLET | Freq: Every day | ORAL | 1 refills | Status: DC

## 2020-02-29 NOTE — Progress Notes (Signed)
Pamela Harper is a 34 y.o. female is here for follow up.  I acted as a Neurosurgeon for [name], PA-C [name], LPN   History of Present Illness:   Chief Complaint  Patient presents with  . Anxiety  . Depression    HPI   Anxiety & Depression Pt following up today was started on Trintellix 5 mg at last visit, used x 1 week had to stop due to diarrhea. Pt started on Zoloft 25 mg daily. Complaint with medication and no side effects. She says she feels dramatically better but may need and increase. Denies SI/HI.  Seeing a new therapist and doing well with this. Going q 2 weeks.  Depression screen [facility] 2/9 02/29/2020 01/25/2020 03/20/2019  Decreased Interest 0 1 1  Down, Depressed, Hopeless 0 2 2  PHQ - 2 Score 0 3 3  Altered sleeping 1 2 0  Tired, decreased energy 2 3 3   Change in appetite 2 3 2   Feeling bad or failure about yourself  0 2 2  Trouble concentrating 2 1 0  Moving slowly or fidgety/restless 0 1 0  Suicidal thoughts 0 0 0  PHQ-9 Score 7 15 10   Difficult doing work/chores Not difficult at all Somewhat difficult Somewhat difficult      Health Maintenance Due  Topic Date Due  . Hepatitis C Screening  Never done  . HIV Screening  Never done    Past Medical History:  Diagnosis Date  . Depression   . Seasonal allergies      Social History   Tobacco Use  . Smoking status: Never Smoker  . Smokeless tobacco: Never Used  Substance Use Topics  . Alcohol use: Yes    Comment: Socially  . Drug use: Never    Past Surgical History:  Procedure Laterality Date  . CESAREAN SECTION  2019  . CHOLECYSTECTOMY  2008    Family History  Problem Relation Age of Onset  . Diabetes Mother        prediabetes  . Sleep apnea Mother   . Bipolar disorder Mother   . Anxiety disorder Mother   . Depression Mother   . Asthma Mother   . Macular degeneration Mother   . Hypothyroidism Mother   . Hypertension Father   . Multiple sclerosis Father   .  Depression Father   . Sleep apnea Father   . Hyperthyroidism Father   . Anxiety disorder Sister   . Macular degeneration Maternal Grandfather   . Diabetes Paternal Grandmother   . Hyperlipidemia Paternal Grandmother   . Hypertension Paternal Grandmother   . Cancer Paternal Grandfather     PMHx, SurgHx, SocialHx, FamHx, Medications, and Allergies were reviewed in the Visit Navigator and updated as appropriate.   There are no problems to display for this patient.   Social History   Tobacco Use  . Smoking status: Never Smoker  . Smokeless tobacco: Never Used  Substance Use Topics  . Alcohol use: Yes    Comment: Socially  . Drug use: Never    Current Medications and Allergies:    Current Outpatient Medications:  .  loratadine (CLARITIN) 10 MG tablet, Take 10 mg by mouth daily., Disp: , Rfl:  .  sertraline (ZOLOFT) 50 MG tablet, Take 1 tablet (50 mg total) by mouth at bedtime., Disp: 90 tablet, Rfl: 1   Allergies  Allergen Reactions  . Codeine Hives    Review of Systems   ROS  Negative unless otherwise specified per  HPI.  Vitals:   Vitals:   02/29/20 1045  BP: 130/86  Pulse: 76  Temp: 98 F (36.7 C)  TempSrc: Temporal  SpO2: 98%  Weight: 164 lb (74.4 kg)  Height: 5\' 4"  (1.626 m)     Body mass index is 28.15 kg/m.   Physical Exam:    Physical Exam Constitutional:      Appearance: She is well-developed.  HENT:     Head: Normocephalic and atraumatic.  Eyes:     Conjunctiva/sclera: Conjunctivae normal.  Pulmonary:     Effort: Pulmonary effort is normal.  Musculoskeletal:        General: Normal range of motion.     Cervical back: Normal range of motion and neck supple.  Skin:    General: Skin is warm and dry.  Neurological:     Mental Status: She is alert and oriented to person, place, and time.  Psychiatric:        Behavior: Behavior normal.        Thought Content: Thought content normal.        Judgment: Judgment normal.      Assessment  and Plan:    Shari was seen today for anxiety and depression.  Diagnoses and all orders for this visit:  Adjustment reaction with anxiety and depression Doing much better. Increase Zoloft to 50 mg daily. Follow-up in 6 months, sooner if concerns.  Other orders -     sertraline (ZOLOFT) 50 MG tablet; Take 1 tablet (50 mg total) by mouth at bedtime.    CMA or LPN served as scribe during this visit. History, Physical, and Plan performed by medical provider. The above documentation has been reviewed and is accurate and complete.  Lanora Manis, PA-C [place], [place] 02/29/2020  Follow-up: No follow-ups on file.

## 2020-03-08 DIAGNOSIS — M25512 Pain in left shoulder: Secondary | ICD-10-CM | POA: Diagnosis not present

## 2020-03-17 DIAGNOSIS — M25512 Pain in left shoulder: Secondary | ICD-10-CM | POA: Diagnosis not present

## 2020-03-24 DIAGNOSIS — M25512 Pain in left shoulder: Secondary | ICD-10-CM | POA: Diagnosis not present

## 2020-03-31 DIAGNOSIS — M25512 Pain in left shoulder: Secondary | ICD-10-CM | POA: Diagnosis not present

## 2020-04-07 DIAGNOSIS — M25512 Pain in left shoulder: Secondary | ICD-10-CM | POA: Diagnosis not present

## 2020-04-13 DIAGNOSIS — M25512 Pain in left shoulder: Secondary | ICD-10-CM | POA: Diagnosis not present

## 2020-04-21 ENCOUNTER — Other Ambulatory Visit: Payer: 59

## 2020-04-22 DIAGNOSIS — Z20822 Contact with and (suspected) exposure to covid-19: Secondary | ICD-10-CM | POA: Diagnosis not present

## 2020-04-26 ENCOUNTER — Encounter: Payer: Self-pay | Admitting: Physician Assistant

## 2020-04-26 ENCOUNTER — Telehealth (INDEPENDENT_AMBULATORY_CARE_PROVIDER_SITE_OTHER): Payer: 59 | Admitting: Physician Assistant

## 2020-04-26 ENCOUNTER — Other Ambulatory Visit: Payer: Self-pay | Admitting: Physician Assistant

## 2020-04-26 VITALS — Ht 64.0 in | Wt 164.0 lb

## 2020-04-26 DIAGNOSIS — R0781 Pleurodynia: Secondary | ICD-10-CM

## 2020-04-26 MED ORDER — TRAMADOL HCL 50 MG PO TABS
50.0000 mg | ORAL_TABLET | Freq: Three times a day (TID) | ORAL | 0 refills | Status: AC | PRN
Start: 2020-04-26 — End: 2020-05-01

## 2020-04-26 MED ORDER — DICLOFENAC SODIUM 75 MG PO TBEC: 75.0000 mg | DELAYED_RELEASE_TABLET | Freq: Two times a day (BID) | ORAL | 0 refills | Status: DC

## 2020-04-26 MED ORDER — BACLOFEN 10 MG PO TABS: 5.0000 mg | ORAL_TABLET | Freq: Every day | ORAL | 0 refills | Status: DC | PRN

## 2020-04-26 NOTE — Progress Notes (Signed)
Virtual Visit via Video   I connected with Pamela Harper on 04/26/20 at 12:00 PM EST by a video enabled telemedicine application and verified that I am speaking with the correct person using two identifiers. Location patient: Home Location provider: [facility], Office Persons participating in the virtual visit: SHAKA ZECH, [name] PA-C, [name], LPN   I discussed the limitations of evaluation and management by telemedicine and the availability of in person appointments. The patient expressed understanding and agreed to proceed.  I acted as a Neurosurgeon for [name], PA-C [employer], LPN   Subjective:   HPI:   Rib pain Pt c/o left lower side rib pain since Friday. Her 4-yr old jumped off an object and his knee hit her rib. Pt is having a lot of pain. Taking ibuprofen which is not helping. Temporary relief with ice.  Denies: SOB, cough, chest pain, palpable abnormality, bruising, swelling. Pain is worse with deep inspiration, sneeze and any movement.   ROS: See pertinent positives and negatives per HPI.  There are no problems to display for this patient.   Social History   Tobacco Use  . Smoking status: Never Smoker  . Smokeless tobacco: Never Used  Substance Use Topics  . Alcohol use: Yes    Comment: Socially    Current Outpatient Medications:  .  ibuprofen (ADVIL) 200 MG tablet, , Disp: , Rfl:  .  sertraline (ZOLOFT) 50 MG tablet, Take 1 tablet (50 mg total) by mouth at bedtime., Disp: 90 tablet, Rfl: 1 .  baclofen (LIORESAL) 10 MG tablet, Take 0.5 tablets (5 mg total) by mouth daily as needed for muscle spasms., Disp: 30 each, Rfl: 0 .  diclofenac (VOLTAREN) 75 MG EC tablet, Take 1 tablet (75 mg total) by mouth 2 (two) times daily., Disp: 30 tablet, Rfl: 0  Allergies  Allergen Reactions  . Codeine Hives    Objective:   VITALS: Per patient if applicable, see vitals. GENERAL: Alert, appears well and in no acute  distress. HEENT: Atraumatic, conjunctiva clear, no obvious abnormalities on inspection of external nose and ears. NECK: Normal movements of the head and neck. CARDIOPULMONARY: No increased WOB. Speaking in clear sentences. I:E ratio WNL.  MS: Moves all visible extremities without noticeable abnormality. PSYCH: Pleasant and cooperative, well-groomed. Speech normal rate and rhythm. Affect is appropriate. Insight and judgement are appropriate. Attention is focused, linear, and appropriate.  NEURO: CN grossly intact. Oriented as arrived to appointment on time with no prompting. Moves both UE equally.  SKIN: No obvious lesions, wounds, erythema, or cyanosis noted on face or hands.  Assessment and Plan:   Pamela Harper was seen today for left rib pain.  Diagnoses and all orders for this visit:  Rib pain Rib contusion, however cannot r/o rib fracture. Xray ordered for her at [facility] to obtain if she has no improvement or any worsening. Oral diclofenac BID, baclofen prn. Tramadol prn.  Advised to avoid use of baclofen and tramadol together. Worsening precautions advised in the interim -- advised to seek emergent care if she develops chest pain or sudden onset SOB. -     DG Ribs Unilateral W/Chest Left; Future  Other orders -     diclofenac (VOLTAREN) 75 MG EC tablet; Take 1 tablet (75 mg total) by mouth 2 (two) times daily. -     baclofen (LIORESAL) 10 MG tablet; Take 0.5 tablets (5 mg total) by mouth daily as needed for muscle spasms.  I discussed the assessment  and treatment plan with the patient. The patient was provided an opportunity to ask questions and all were answered. The patient agreed with the plan and demonstrated an understanding of the instructions.   The patient was advised to call back or seek an in-person evaluation if the symptoms worsen or if the condition fails to improve as anticipated.   CMA or LPN served as scribe during this visit. History, Physical, and Plan  performed by medical provider. The above documentation has been reviewed and is accurate and complete.  [place], [place] 04/26/2020

## 2020-05-03 ENCOUNTER — Ambulatory Visit (INDEPENDENT_AMBULATORY_CARE_PROVIDER_SITE_OTHER)
Admission: RE | Admit: 2020-05-03 | Discharge: 2020-05-03 | Disposition: A | Discharge status: Home / Self Care | Payer: 59 | Source: Ambulatory Visit | Attending: Physician Assistant | Admitting: Physician Assistant

## 2020-05-03 ENCOUNTER — Other Ambulatory Visit: Payer: Self-pay

## 2020-05-03 DIAGNOSIS — R0781 Pleurodynia: Secondary | ICD-10-CM

## 2020-05-03 DIAGNOSIS — S20212A Contusion of left front wall of thorax, initial encounter: Secondary | ICD-10-CM | POA: Diagnosis not present

## 2020-05-18 ENCOUNTER — Other Ambulatory Visit: Payer: 59

## 2020-05-18 DIAGNOSIS — Z20822 Contact with and (suspected) exposure to covid-19: Secondary | ICD-10-CM | POA: Diagnosis not present

## 2020-05-20 ENCOUNTER — Encounter: Payer: Self-pay | Admitting: Physician Assistant

## 2020-05-20 LAB — SARS-COV-2, NAA 2 DAY TAT

## 2020-05-20 LAB — NOVEL CORONAVIRUS, NAA: SARS-CoV-2, NAA: DETECTED — AB

## 2020-05-22 ENCOUNTER — Telehealth: Payer: Self-pay | Admitting: Unknown Physician Specialty

## 2020-05-22 ENCOUNTER — Encounter: Payer: Self-pay | Admitting: Physician Assistant

## 2020-05-22 NOTE — Telephone Encounter (Signed)
Called to discuss with patient about Covid symptoms and the use of a monoclonal antibody infusion for those with mild to moderate Covid symptoms and at a high risk of hospitalization.  Pt is qualified for this infusion at the [facility] infusion center due to BMI>35.  She is vaccinated and feeling better and discussed mab is not the appropriate treatment at this time

## 2020-08-02 DIAGNOSIS — Z3201 Encounter for pregnancy test, result positive: Secondary | ICD-10-CM | POA: Diagnosis not present

## 2020-08-02 DIAGNOSIS — N911 Secondary amenorrhea: Secondary | ICD-10-CM | POA: Diagnosis not present

## 2020-08-25 ENCOUNTER — Other Ambulatory Visit (HOSPITAL_BASED_OUTPATIENT_CLINIC_OR_DEPARTMENT_OTHER): Payer: Self-pay

## 2020-08-30 DIAGNOSIS — Z368A Encounter for antenatal screening for other genetic defects: Secondary | ICD-10-CM | POA: Diagnosis not present

## 2020-08-30 DIAGNOSIS — Z369 Encounter for antenatal screening, unspecified: Secondary | ICD-10-CM | POA: Diagnosis not present

## 2020-08-30 DIAGNOSIS — O219 Vomiting of pregnancy, unspecified: Secondary | ICD-10-CM | POA: Diagnosis not present

## 2020-08-30 DIAGNOSIS — Z113 Encounter for screening for infections with a predominantly sexual mode of transmission: Secondary | ICD-10-CM | POA: Diagnosis not present

## 2020-08-30 DIAGNOSIS — O09521 Supervision of elderly multigravida, first trimester: Secondary | ICD-10-CM | POA: Diagnosis not present

## 2020-08-30 DIAGNOSIS — Z9889 Other specified postprocedural states: Secondary | ICD-10-CM | POA: Diagnosis not present

## 2020-08-30 DIAGNOSIS — Z3A10 10 weeks gestation of pregnancy: Secondary | ICD-10-CM | POA: Diagnosis not present

## 2020-08-30 LAB — OB RESULTS CONSOLE HEPATITIS B SURFACE ANTIGEN: Hepatitis B Surface Ag: NEGATIVE

## 2020-08-30 LAB — OB RESULTS CONSOLE HIV ANTIBODY (ROUTINE TESTING): HIV: NONREACTIVE

## 2020-08-30 LAB — OB RESULTS CONSOLE RPR: RPR: NONREACTIVE

## 2020-08-30 LAB — OB RESULTS CONSOLE GC/CHLAMYDIA
Chlamydia: NEGATIVE
Gonorrhea: NEGATIVE

## 2020-08-30 LAB — OB RESULTS CONSOLE ANTIBODY SCREEN: Antibody Screen: NEGATIVE

## 2020-08-30 LAB — OB RESULTS CONSOLE ABO/RH: RH Type: POSITIVE

## 2020-08-30 LAB — OB RESULTS CONSOLE RUBELLA ANTIBODY, IGM: Rubella: IMMUNE

## 2020-09-01 LAB — HM HEPATITIS C SCREENING LAB: HM Hepatitis Screen: NEGATIVE

## 2020-09-14 DIAGNOSIS — O09521 Supervision of elderly multigravida, first trimester: Secondary | ICD-10-CM | POA: Diagnosis not present

## 2020-09-14 DIAGNOSIS — Z3A10 10 weeks gestation of pregnancy: Secondary | ICD-10-CM | POA: Diagnosis not present

## 2020-09-28 ENCOUNTER — Other Ambulatory Visit: Payer: Self-pay | Admitting: Physician Assistant

## 2020-09-28 ENCOUNTER — Other Ambulatory Visit (HOSPITAL_COMMUNITY): Payer: Self-pay

## 2020-09-28 MED ORDER — SERTRALINE HCL 50 MG PO TABS
ORAL_TABLET | Freq: Every day | ORAL | 0 refills | Status: DC
  Filled 2020-09-28: qty 90, 90d supply, fill #0

## 2020-11-04 DIAGNOSIS — Z3A19 19 weeks gestation of pregnancy: Secondary | ICD-10-CM | POA: Diagnosis not present

## 2020-11-04 DIAGNOSIS — O09519 Supervision of elderly primigravida, unspecified trimester: Secondary | ICD-10-CM | POA: Diagnosis not present

## 2020-11-04 DIAGNOSIS — Z363 Encounter for antenatal screening for malformations: Secondary | ICD-10-CM | POA: Diagnosis not present

## 2020-11-07 DIAGNOSIS — Z7689 Persons encountering health services in other specified circumstances: Secondary | ICD-10-CM | POA: Diagnosis not present

## 2020-11-07 DIAGNOSIS — Z8632 Personal history of gestational diabetes: Secondary | ICD-10-CM | POA: Diagnosis not present

## 2020-11-08 ENCOUNTER — Other Ambulatory Visit (HOSPITAL_COMMUNITY): Payer: Self-pay

## 2020-11-08 MED ORDER — FREESTYLE LANCETS MISC
1 refills | Status: DC
Start: 2020-11-08 — End: 2021-03-22
  Filled 2020-11-08: qty 100, 25d supply, fill #0

## 2020-11-08 MED ORDER — FREESTYLE LITE W/DEVICE KIT
PACK | 0 refills | Status: DC
Start: 2020-11-08 — End: 2021-03-22
  Filled 2020-11-08: qty 1, 30d supply, fill #0

## 2020-11-08 MED ORDER — GLUCOSE BLOOD VI STRP
ORAL_STRIP | 1 refills | Status: DC
Start: 2020-11-08 — End: 2021-03-22
  Filled 2020-11-08: qty 100, 25d supply, fill #0

## 2020-11-10 ENCOUNTER — Other Ambulatory Visit (HOSPITAL_COMMUNITY): Payer: Self-pay

## 2020-11-24 ENCOUNTER — Ambulatory Visit (INDEPENDENT_AMBULATORY_CARE_PROVIDER_SITE_OTHER): Payer: 59 | Admitting: Family Medicine

## 2020-11-24 ENCOUNTER — Encounter: Payer: Self-pay | Admitting: Family Medicine

## 2020-11-24 ENCOUNTER — Other Ambulatory Visit: Payer: Self-pay

## 2020-11-24 VITALS — BP 95/61 | HR 88 | Temp 98.3°F | Ht 64.0 in | Wt 193.2 lb

## 2020-11-24 DIAGNOSIS — Z3A22 22 weeks gestation of pregnancy: Secondary | ICD-10-CM | POA: Diagnosis not present

## 2020-11-24 DIAGNOSIS — O09522 Supervision of elderly multigravida, second trimester: Secondary | ICD-10-CM | POA: Diagnosis not present

## 2020-11-24 DIAGNOSIS — Z3490 Encounter for supervision of normal pregnancy, unspecified, unspecified trimester: Secondary | ICD-10-CM

## 2020-11-24 DIAGNOSIS — O34211 Maternal care for low transverse scar from previous cesarean delivery: Secondary | ICD-10-CM | POA: Diagnosis not present

## 2020-11-24 DIAGNOSIS — Z0001 Encounter for general adult medical examination with abnormal findings: Secondary | ICD-10-CM | POA: Diagnosis not present

## 2020-11-24 DIAGNOSIS — Z13220 Encounter for screening for lipoid disorders: Secondary | ICD-10-CM

## 2020-11-24 DIAGNOSIS — Z362 Encounter for other antenatal screening follow-up: Secondary | ICD-10-CM | POA: Diagnosis not present

## 2020-11-24 DIAGNOSIS — O99342 Other mental disorders complicating pregnancy, second trimester: Secondary | ICD-10-CM | POA: Diagnosis not present

## 2020-11-24 LAB — LIPID PANEL
Cholesterol: 197 mg/dL (ref 0–200)
HDL: 91.8 mg/dL (ref >39.00)
LDL Cholesterol: 70 mg/dL (ref 0–99)
NonHDL: 105.19
Total CHOL/HDL Ratio: 2
Triglycerides: 174 mg/dL — ABNORMAL HIGH (ref 0.0–149.0)
VLDL: 34.8 mg/dL (ref 0.0–40.0)

## 2020-11-24 NOTE — Progress Notes (Signed)
**Note Pamela-Identified via Obfuscation** Chief Complaint:  Pamela Harper is a 35 y.o. female who presents today for her annual comprehensive physical exam.    Assessment/Plan:  New/Acute Problems: Pregnancy Doing well.  Managed by OB/GYN.  She is keeping a close eye on her blood sugars and managing diet/exercise to prevent progression to gestational diabetes.  Chronic Problems Addressed Today: Anxiety/Depression On Zoloft 50 mg daily tolerating well.  Preventative Healthcare: Up-to-date on vaccines and screenings.  We will check lipids today per patient request.  Patient Counseling(The following topics were reviewed and/or handout was given):  -Nutrition: Stressed importance of moderation in sodium/caffeine intake, saturated fat and cholesterol, caloric balance, sufficient intake of fresh fruits, vegetables, and fiber.  -Stressed the importance of regular exercise.   -Substance Abuse: Discussed cessation/primary prevention of tobacco, alcohol, or other drug use; driving or other dangerous activities under the influence; availability of treatment for abuse.   -Injury prevention: Discussed safety belts, safety helmets, smoke detector, smoking near bedding or upholstery.   -Sexuality: Discussed sexually transmitted diseases, partner selection, use of condoms, avoidance of unintended pregnancy and contraceptive alternatives.   -Dental health: Discussed importance of regular tooth brushing, flossing, and dental visits.  -Health maintenance and immunizations reviewed. Please refer to Health maintenance section.  Return to care in 1 year for next preventative visit.     Subjective:  HPI:  She has no acute complaints today.   Lifestyle Diet: Cutting out carbs and sweets.  Exercise: Busy with children at home. Likes walking.   Depression screen [facility] 2/9 02/29/2020  Decreased Interest 0  Down, Depressed, Hopeless 0  PHQ - 2 Score 0  Altered sleeping 1  Tired, decreased energy 2  Change in appetite 2  Feeling bad or  failure about yourself  0  Trouble concentrating 2  Moving slowly or fidgety/restless 0  Suicidal thoughts 0  PHQ-9 Score 7  Difficult doing work/chores Not difficult at all    Health Maintenance Due  Topic Date Due   HIV Screening  Never done   Hepatitis C Screening  Never done     ROS: Per HPI, otherwise a complete review of systems was negative.   PMH:  The following were reviewed and entered/updated in epic: Past Medical History:  Diagnosis Date   Depression    Seasonal allergies    There are no problems to display for this patient.  Past Surgical History:  Procedure Laterality Date   CESAREAN SECTION  2019   CHOLECYSTECTOMY  2008    Family History  Problem Relation Age of Onset   Diabetes Mother        prediabetes   Sleep apnea Mother    Bipolar disorder Mother    Anxiety disorder Mother    Depression Mother    Asthma Mother    Macular degeneration Mother    Hypothyroidism Mother    Hypertension Father    Multiple sclerosis Father    Depression Father    Sleep apnea Father    Hyperthyroidism Father    Anxiety disorder Sister    Macular degeneration Maternal Grandfather    Diabetes Paternal Grandmother    Hyperlipidemia Paternal Grandmother    Hypertension Paternal Grandmother    Cancer Paternal Grandfather     Medications- reviewed and updated Current Outpatient Medications  Medication Sig Dispense Refill   Blood Glucose Monitoring Suppl (FREESTYLE LITE) w/Device KIT Use as directed 1 kit 0   glucose blood test strip Use to test blood sugar 4 times a day 120 each  1   Lancets (FREESTYLE) lancets Use to test blood sugar 4 times a day 120 each 1   sertraline (ZOLOFT) 50 MG tablet Take 1 tablet (50 mg total) by mouth at bedtime. 90 tablet 1   Docosahexaenoic Acid (PRENATAL DHA) 200 MG CAPS Prenatal + DHA     No current facility-administered medications for this visit.    Allergies-reviewed and updated Allergies  Allergen Reactions   Codeine  Hives    Social History   Socioeconomic History   Marital status: Married    Spouse name: Not on file   Number of children: Not on file   Years of education: Not on file   Highest education level: Not on file  Occupational History   Not on file  Tobacco Use   Smoking status: Never   Smokeless tobacco: Never  Substance and Sexual Activity   Alcohol use: Yes    Comment: Socially   Drug use: Never   Sexual activity: Not on file  Other Topics Concern   Not on file  Social History Narrative   Not on file   Social Determinants of Health   Financial Resource Strain: Not on file  Food Insecurity: Not on file  Transportation Needs: Not on file  Physical Activity: Not on file  Stress: Not on file  Social Connections: Not on file        Objective:  Physical Exam: BP 95/61   Pulse 88   Temp 98.3 F (36.8 C) (Temporal)   Ht $R'5\' 4"'eR$  (1.626 m)   Wt 193 lb 3.2 oz (87.6 kg)   LMP 06/20/2020   SpO2 97%   BMI 33.16 kg/m   Body mass index is 33.16 kg/m. Wt Readings from Last 3 Encounters:  11/24/20 193 lb 3.2 oz (87.6 kg)  04/26/20 164 lb (74.4 kg)  02/29/20 164 lb (74.4 kg)   Gen: NAD, resting comfortably HEENT: TMs normal bilaterally. OP clear. No thyromegaly noted.  CV: RRR with no murmurs appreciated Pulm: NWOB, CTAB with no crackles, wheezes, or rhonchi GI: Normal bowel sounds present. Soft, Nontender, Nondistended. MSK: no edema, cyanosis, or clubbing noted Skin: warm, dry Neuro: CN2-12 grossly intact. Strength 5/5 in upper and lower extremities. Reflexes symmetric and intact bilaterally.  Psych: Normal affect and thought content     Taran Haynesworth M. Jerline Pain, MD 11/24/2020 10:34 AM

## 2020-11-24 NOTE — Patient Instructions (Signed)
It was very nice to see you today!  We will check your cholesterol level today.  Please continue working on diet and exercise.  Let me know when you need a refill on your Zoloft.    Take care, Dr Jerline Pain  PLEASE NOTE:  If you had any lab tests please let us know if you have not heard back within a few days. You may see your results on mychart before we have a chance to review them but we will give you a call once they are reviewed by [place]. If we ordered any referrals today, please let us know if you have not heard from their office within the next week.   Please try these tips to maintain a healthy lifestyle:  Eat at least 3 REAL meals and 1-2 snacks per day.  Aim for no more than 5 hours between eating.  If you eat breakfast, please do so within one hour of getting up.   Each meal should contain half fruits/vegetables, one quarter protein, and one quarter carbs (no bigger than a computer mouse)  Cut down on sweet beverages. This includes juice, soda, and sweet tea.   Drink at least 1 glass of water with each meal and aim for at least 8 glasses per day  Exercise at least 150 minutes every week.    Preventive Care 49-37 Years Old, Female Preventive care refers to lifestyle choices and visits with your health care provider that can promote health and wellness. This includes: A yearly physical exam. This is also called an annual wellness visit. Regular dental and eye exams. Immunizations. Screening for certain conditions. Healthy lifestyle choices, such as: Eating a healthy diet. Getting regular exercise. Not using drugs or products that contain nicotine and tobacco. Limiting alcohol use. What can I expect for my preventive care visit? Physical exam Your health care provider may check your: Height and weight. These may be used to calculate your BMI (body mass index). BMI is a measurement that tells if you are at a healthy weight. Heart rate and blood pressure. Body  temperature. Skin for abnormal spots. Counseling Your health care provider may ask you questions about your: Past medical problems. Family's medical history. Alcohol, tobacco, and drug use. Emotional well-being. Home life and relationship well-being. Sexual activity. Diet, exercise, and sleep habits. Work and work Statistician. Access to firearms. Method of birth control. Menstrual cycle. Pregnancy history. What immunizations do I need?  Vaccines are usually given at various ages, according to a schedule. Your health care provider will recommend vaccines for you based on your age, medicalhistory, and lifestyle or other factors, such as travel or where you work. What tests do I need?  Blood tests Lipid and cholesterol levels. These may be checked every 5 years starting at age 53. Hepatitis C test. Hepatitis B test. Screening Diabetes screening. This is done by checking your blood sugar (glucose) after you have not eaten for a while (fasting). STD (sexually transmitted disease) testing, if you are at risk. BRCA-related cancer screening. This may be done if you have a family history of breast, ovarian, tubal, or peritoneal cancers. Pelvic exam and Pap test. This may be done every 3 years starting at age 60. Starting at age 52, this may be done every 5 years if you have a Pap test in combination with an HPV test. Talk with your health care provider about your test results, treatment options,and if necessary, the need for more tests. Follow these instructions at home: Eating and  drinking  Eat a healthy diet that includes fresh fruits and vegetables, whole grains, lean protein, and low-fat dairy products. Take vitamin and mineral supplements as recommended by your health care provider. Do not drink alcohol if: Your health care provider tells you not to drink. You are pregnant, may be pregnant, or are planning to become pregnant. If you drink alcohol: Limit how much you have to 0-1  drink a day. Be aware of how much alcohol is in your drink. In the [place] one drink equals one 12 oz bottle of beer (355 mL), one 5 oz glass of wine (148 mL), or one 1 oz glass of hard liquor (44 mL).  Lifestyle Take daily care of your teeth and gums. Brush your teeth every morning and night with fluoride toothpaste. Floss one time each day. Stay active. Exercise for at least 30 minutes 5 or more days each week. Do not use any products that contain nicotine or tobacco, such as cigarettes, e-cigarettes, and chewing tobacco. If you need help quitting, ask your health care provider. Do not use drugs. If you are sexually active, practice safe sex. Use a condom or other form of protection to prevent STIs (sexually transmitted infections). If you do not wish to become pregnant, use a form of birth control. If you plan to become pregnant, see your health care provider for a prepregnancy visit. Find healthy ways to cope with stress, such as: Meditation, yoga, or listening to music. Journaling. Talking to a trusted person. Spending time with friends and family. Safety Always wear your seat belt while driving or riding in a vehicle. Do not drive: If you have been drinking alcohol. Do not ride with someone who has been drinking. When you are tired or distracted. While texting. Wear a helmet and other protective equipment during sports activities. If you have firearms in your house, make sure you follow all gun safety procedures. Seek help if you have been physically or sexually abused. What's next? Go to your health care provider once a year for an annual wellness visit. Ask your health care provider how often you should have your eyes and teeth checked. Stay up to date on all vaccines. This information is not intended to replace advice given to you by your health care provider. Make sure you discuss any questions you have with your healthcare provider. Document Revised: 01/17/2020 Document  Reviewed: 01/30/2018 Elsevier Patient Education  2022 [employer].

## 2020-11-25 NOTE — Progress Notes (Signed)
Please inform patient of the following:  Cholesterol panel is stable. Her triglycerides are just barely elevated but not enough to cause any concern. We can recheck again in a year or so.  Katina Degree. Jimmey Ralph, MD 11/25/2020 8:11 AM

## 2021-01-03 ENCOUNTER — Other Ambulatory Visit (HOSPITAL_COMMUNITY): Payer: Self-pay

## 2021-01-03 DIAGNOSIS — Z3A28 28 weeks gestation of pregnancy: Secondary | ICD-10-CM | POA: Diagnosis not present

## 2021-01-03 DIAGNOSIS — O26899 Other specified pregnancy related conditions, unspecified trimester: Secondary | ICD-10-CM | POA: Diagnosis not present

## 2021-01-03 DIAGNOSIS — Z23 Encounter for immunization: Secondary | ICD-10-CM | POA: Diagnosis not present

## 2021-01-03 DIAGNOSIS — Z3483 Encounter for supervision of other normal pregnancy, third trimester: Secondary | ICD-10-CM | POA: Diagnosis not present

## 2021-01-30 ENCOUNTER — Other Ambulatory Visit (HOSPITAL_COMMUNITY): Payer: Self-pay

## 2021-01-31 ENCOUNTER — Other Ambulatory Visit (HOSPITAL_COMMUNITY): Payer: Self-pay

## 2021-01-31 ENCOUNTER — Other Ambulatory Visit: Payer: Self-pay | Admitting: *Deleted

## 2021-01-31 MED ORDER — SERTRALINE HCL 50 MG PO TABS
50.0000 mg | ORAL_TABLET | Freq: Every day | ORAL | 0 refills | Status: DC
Start: 2021-01-31 — End: 2021-05-15
  Filled 2021-01-31: qty 90, 90d supply, fill #0

## 2021-02-01 DIAGNOSIS — O09523 Supervision of elderly multigravida, third trimester: Secondary | ICD-10-CM | POA: Diagnosis not present

## 2021-02-01 DIAGNOSIS — O09519 Supervision of elderly primigravida, unspecified trimester: Secondary | ICD-10-CM | POA: Diagnosis not present

## 2021-02-01 DIAGNOSIS — Z3A32 32 weeks gestation of pregnancy: Secondary | ICD-10-CM | POA: Diagnosis not present

## 2021-02-01 DIAGNOSIS — O4443 Low lying placenta NOS or without hemorrhage, third trimester: Secondary | ICD-10-CM | POA: Diagnosis not present

## 2021-03-01 DIAGNOSIS — Z3A36 36 weeks gestation of pregnancy: Secondary | ICD-10-CM | POA: Diagnosis not present

## 2021-03-01 DIAGNOSIS — O34211 Maternal care for low transverse scar from previous cesarean delivery: Secondary | ICD-10-CM | POA: Diagnosis not present

## 2021-03-01 DIAGNOSIS — O09523 Supervision of elderly multigravida, third trimester: Secondary | ICD-10-CM | POA: Diagnosis not present

## 2021-03-01 DIAGNOSIS — Z3685 Encounter for antenatal screening for Streptococcus B: Secondary | ICD-10-CM | POA: Diagnosis not present

## 2021-03-01 LAB — OB RESULTS CONSOLE GBS: GBS: NEGATIVE

## 2021-03-06 ENCOUNTER — Encounter (HOSPITAL_COMMUNITY): Payer: Self-pay | Admitting: *Deleted

## 2021-03-06 NOTE — Patient Instructions (Addendum)
MARQUETTE PIONTEK  03/06/2021   Your procedure is scheduled on:  03/20/2021  Arrive at 0800 at [employer] [facility] on [address] at [facility] and [employer].  You are invited to use the FREE valet parking or use the Visitor's parking deck.  Pick up the phone at the desk and dial [phone].  Call this number if you have problems the morning of surgery: [phone]   Remember:   Do not eat food:(After Midnight) Desps de medianoche.  Do not drink clear liquids: (After Midnight) Desps de medianoche.  Take these medicines the morning of surgery with A SIP OF WATER:  none   Do not wear jewelry, make-up or nail polish.  Do not wear lotions, powders, or perfumes. Do not wear deodorant.  Do not shave 48 hours prior to surgery.  Do not bring valuables to the hospital.  [facility] is not   responsible for any belongings or valuables brought to the hospital.  Contacts, dentures or bridgework may not be worn into surgery.  Leave suitcase in the car. After surgery it may be brought to your room.  For patients admitted to the hospital, checkout time is 11:00 AM the day of              discharge.      Please read over the following fact sheets that you were given: Preparing for Surgery

## 2021-03-07 ENCOUNTER — Telehealth (HOSPITAL_COMMUNITY): Payer: Self-pay | Admitting: *Deleted

## 2021-03-07 NOTE — Telephone Encounter (Signed)
Preadmission screen  

## 2021-03-08 ENCOUNTER — Encounter (HOSPITAL_COMMUNITY): Payer: Self-pay

## 2021-03-08 DIAGNOSIS — Z23 Encounter for immunization: Secondary | ICD-10-CM | POA: Diagnosis not present

## 2021-03-08 DIAGNOSIS — O09523 Supervision of elderly multigravida, third trimester: Secondary | ICD-10-CM | POA: Diagnosis not present

## 2021-03-08 DIAGNOSIS — Z3A37 37 weeks gestation of pregnancy: Secondary | ICD-10-CM | POA: Diagnosis not present

## 2021-03-17 ENCOUNTER — Other Ambulatory Visit: Payer: Self-pay

## 2021-03-17 ENCOUNTER — Other Ambulatory Visit: Payer: Self-pay | Admitting: Obstetrics and Gynecology

## 2021-03-17 ENCOUNTER — Encounter (HOSPITAL_COMMUNITY)
Admission: RE | Admit: 2021-03-17 | Discharge: 2021-03-17 | Disposition: A | Discharge status: Home / Self Care | Payer: 59 | Source: Ambulatory Visit | Attending: Obstetrics and Gynecology | Admitting: Obstetrics and Gynecology

## 2021-03-17 DIAGNOSIS — O34219 Maternal care for unspecified type scar from previous cesarean delivery: Secondary | ICD-10-CM | POA: Diagnosis not present

## 2021-03-17 DIAGNOSIS — Z01812 Encounter for preprocedural laboratory examination: Secondary | ICD-10-CM | POA: Insufficient documentation

## 2021-03-17 DIAGNOSIS — Z3A00 Weeks of gestation of pregnancy not specified: Secondary | ICD-10-CM | POA: Insufficient documentation

## 2021-03-17 DIAGNOSIS — Z3A39 39 weeks gestation of pregnancy: Secondary | ICD-10-CM | POA: Diagnosis not present

## 2021-03-17 DIAGNOSIS — O99344 Other mental disorders complicating childbirth: Secondary | ICD-10-CM | POA: Diagnosis not present

## 2021-03-17 DIAGNOSIS — O34211 Maternal care for low transverse scar from previous cesarean delivery: Secondary | ICD-10-CM | POA: Diagnosis not present

## 2021-03-17 DIAGNOSIS — Z20822 Contact with and (suspected) exposure to covid-19: Secondary | ICD-10-CM | POA: Insufficient documentation

## 2021-03-17 DIAGNOSIS — F419 Anxiety disorder, unspecified: Secondary | ICD-10-CM | POA: Diagnosis not present

## 2021-03-17 LAB — TYPE AND SCREEN
ABO/RH(D): A POS
Antibody Screen: NEGATIVE

## 2021-03-17 LAB — CBC
HCT: 39.4 % (ref 36.0–46.0)
Hemoglobin: 12.6 g/dL (ref 12.0–15.0)
MCH: 24.7 pg — ABNORMAL LOW (ref 26.0–34.0)
MCHC: 32 g/dL (ref 30.0–36.0)
MCV: 77.1 fL — ABNORMAL LOW (ref 80.0–100.0)
Platelets: 295 10*3/uL (ref 150–400)
RBC: 5.11 MIL/uL (ref 3.87–5.11)
RDW: 14.7 % (ref 11.5–15.5)
WBC: 15.1 10*3/uL — ABNORMAL HIGH (ref 4.0–10.5)
nRBC: 0 % (ref 0.0–0.2)

## 2021-03-18 LAB — SARS CORONAVIRUS 2 (TAT 6-24 HRS): SARS Coronavirus 2: NEGATIVE

## 2021-03-18 LAB — RPR: RPR Ser Ql: NONREACTIVE

## 2021-03-20 ENCOUNTER — Encounter (HOSPITAL_COMMUNITY)
Admission: RE | Disposition: A | Discharge status: Home / Self Care | Payer: Self-pay | Source: Home / Self Care | Attending: Obstetrics and Gynecology

## 2021-03-20 ENCOUNTER — Inpatient Hospital Stay (HOSPITAL_COMMUNITY): Payer: 59

## 2021-03-20 ENCOUNTER — Inpatient Hospital Stay (HOSPITAL_COMMUNITY)
Admission: RE | Admit: 2021-03-20 | Discharge: 2021-03-22 | DRG: 788 | Disposition: A | Discharge status: Home / Self Care | Payer: 59 | Attending: Obstetrics and Gynecology | Admitting: Obstetrics and Gynecology

## 2021-03-20 ENCOUNTER — Encounter (HOSPITAL_COMMUNITY): Payer: Self-pay | Admitting: Obstetrics and Gynecology

## 2021-03-20 ENCOUNTER — Other Ambulatory Visit: Payer: Self-pay

## 2021-03-20 DIAGNOSIS — O34219 Maternal care for unspecified type scar from previous cesarean delivery: Secondary | ICD-10-CM | POA: Diagnosis not present

## 2021-03-20 DIAGNOSIS — O34211 Maternal care for low transverse scar from previous cesarean delivery: Secondary | ICD-10-CM | POA: Diagnosis present

## 2021-03-20 DIAGNOSIS — Z3A39 39 weeks gestation of pregnancy: Secondary | ICD-10-CM | POA: Diagnosis not present

## 2021-03-20 DIAGNOSIS — O99344 Other mental disorders complicating childbirth: Secondary | ICD-10-CM | POA: Diagnosis present

## 2021-03-20 DIAGNOSIS — F419 Anxiety disorder, unspecified: Secondary | ICD-10-CM | POA: Diagnosis present

## 2021-03-20 SURGERY — Surgical Case
Anesthesia: Spinal

## 2021-03-20 MED ORDER — IBUPROFEN 600 MG PO TABS
600.0000 mg | ORAL_TABLET | Freq: Four times a day (QID) | ORAL | Status: DC
  Administered 2021-03-20 – 2021-03-22 (×8): 600 mg via ORAL
  Filled 2021-03-20 (×8): qty 1

## 2021-03-20 MED ORDER — PHENYLEPHRINE HCL-NACL 20-0.9 MG/250ML-% IV SOLN
INTRAVENOUS | Status: AC
  Filled 2021-03-20: qty 250

## 2021-03-20 MED ORDER — PROMETHAZINE HCL 25 MG/ML IJ SOLN: 6.2500 mg | INTRAMUSCULAR | Status: DC | PRN

## 2021-03-20 MED ORDER — METOCLOPRAMIDE HCL 5 MG/ML IJ SOLN
INTRAMUSCULAR | Status: AC
  Filled 2021-03-20: qty 2

## 2021-03-20 MED ORDER — DIPHENHYDRAMINE HCL 25 MG PO CAPS: 25.0000 mg | ORAL_CAPSULE | Freq: Four times a day (QID) | ORAL | Status: DC | PRN

## 2021-03-20 MED ORDER — TETANUS-DIPHTH-ACELL PERTUSSIS 5-2.5-18.5 LF-MCG/0.5 IM SUSY: 0.5000 mL | PREFILLED_SYRINGE | Freq: Once | INTRAMUSCULAR | Status: DC

## 2021-03-20 MED ORDER — DIPHENHYDRAMINE HCL 25 MG PO CAPS: 25.0000 mg | ORAL_CAPSULE | ORAL | Status: DC | PRN

## 2021-03-20 MED ORDER — ACETAMINOPHEN 500 MG PO TABS
1000.0000 mg | ORAL_TABLET | ORAL | Status: AC
  Administered 2021-03-20: 1000 mg via ORAL

## 2021-03-20 MED ORDER — PHENYLEPHRINE HCL-NACL 20-0.9 MG/250ML-% IV SOLN
INTRAVENOUS | Status: DC | PRN
  Administered 2021-03-20: 60 ug/min via INTRAVENOUS

## 2021-03-20 MED ORDER — COCONUT OIL OIL
1.0000 "application " | TOPICAL_OIL | Status: DC | PRN
  Administered 2021-03-20 – 2021-03-22 (×2): 1 via TOPICAL

## 2021-03-20 MED ORDER — BUPIVACAINE IN DEXTROSE 0.75-8.25 % IT SOLN
INTRATHECAL | Status: DC | PRN
  Administered 2021-03-20: 1.5 mL via INTRATHECAL

## 2021-03-20 MED ORDER — DIBUCAINE (PERIANAL) 1 % EX OINT: 1.0000 "application " | TOPICAL_OINTMENT | CUTANEOUS | Status: DC | PRN

## 2021-03-20 MED ORDER — KETOROLAC TROMETHAMINE 30 MG/ML IJ SOLN
30.0000 mg | Freq: Once | INTRAMUSCULAR | Status: AC | PRN
  Administered 2021-03-20: 30 mg via INTRAVENOUS

## 2021-03-20 MED ORDER — PRENATAL MULTIVITAMIN CH
1.0000 | ORAL_TABLET | Freq: Every day | ORAL | Status: DC
  Administered 2021-03-20 – 2021-03-21 (×2): 1 via ORAL
  Filled 2021-03-20 (×2): qty 1

## 2021-03-20 MED ORDER — DEXAMETHASONE SODIUM PHOSPHATE 10 MG/ML IJ SOLN
INTRAMUSCULAR | Status: DC | PRN
  Administered 2021-03-20: 10 mg via INTRAVENOUS

## 2021-03-20 MED ORDER — ACETAMINOPHEN 325 MG PO TABS
650.0000 mg | ORAL_TABLET | ORAL | Status: DC | PRN
  Administered 2021-03-21 – 2021-03-22 (×2): 650 mg via ORAL
  Filled 2021-03-20 (×2): qty 2

## 2021-03-20 MED ORDER — CEFAZOLIN SODIUM-DEXTROSE 2-4 GM/100ML-% IV SOLN
INTRAVENOUS | Status: AC
  Filled 2021-03-20: qty 100

## 2021-03-20 MED ORDER — OXYTOCIN-SODIUM CHLORIDE 30-0.9 UT/500ML-% IV SOLN
2.5000 [IU]/h | INTRAVENOUS | Status: DC | PRN
  Filled 2021-03-20: qty 500

## 2021-03-20 MED ORDER — SODIUM CHLORIDE 0.9% FLUSH: 3.0000 mL | INTRAVENOUS | Status: DC | PRN

## 2021-03-20 MED ORDER — WITCH HAZEL-GLYCERIN EX PADS: 1.0000 "application " | MEDICATED_PAD | CUTANEOUS | Status: DC | PRN

## 2021-03-20 MED ORDER — ONDANSETRON HCL 4 MG/2ML IJ SOLN: 4.0000 mg | Freq: Three times a day (TID) | INTRAMUSCULAR | Status: DC | PRN

## 2021-03-20 MED ORDER — ONDANSETRON HCL 4 MG PO TABS: 4.0000 mg | ORAL_TABLET | ORAL | Status: DC | PRN

## 2021-03-20 MED ORDER — SIMETHICONE 80 MG PO CHEW: 80.0000 mg | CHEWABLE_TABLET | ORAL | Status: DC | PRN

## 2021-03-20 MED ORDER — NALOXONE HCL 4 MG/10ML IJ SOLN
1.0000 ug/kg/h | INTRAVENOUS | Status: DC | PRN
  Filled 2021-03-20: qty 5

## 2021-03-20 MED ORDER — SOD CITRATE-CITRIC ACID 500-334 MG/5ML PO SOLN: 30.0000 mL | ORAL | Status: DC

## 2021-03-20 MED ORDER — OXYCODONE HCL 5 MG PO TABS
10.0000 mg | ORAL_TABLET | ORAL | Status: DC | PRN
  Administered 2021-03-21 – 2021-03-22 (×6): 10 mg via ORAL
  Filled 2021-03-20 (×6): qty 2

## 2021-03-20 MED ORDER — ACETAMINOPHEN 500 MG PO TABS
ORAL_TABLET | ORAL | Status: AC
  Filled 2021-03-20: qty 2

## 2021-03-20 MED ORDER — ONDANSETRON HCL 4 MG/2ML IJ SOLN
INTRAMUSCULAR | Status: AC
  Filled 2021-03-20: qty 2

## 2021-03-20 MED ORDER — OXYTOCIN-SODIUM CHLORIDE 30-0.9 UT/500ML-% IV SOLN
INTRAVENOUS | Status: DC | PRN
  Administered 2021-03-20: 300 mL via INTRAVENOUS

## 2021-03-20 MED ORDER — CEFAZOLIN SODIUM-DEXTROSE 2-4 GM/100ML-% IV SOLN
2.0000 g | INTRAVENOUS | Status: AC
  Administered 2021-03-20: 2 g via INTRAVENOUS

## 2021-03-20 MED ORDER — STERILE WATER FOR IRRIGATION IR SOLN
Status: DC | PRN
  Administered 2021-03-20: 1000 mL

## 2021-03-20 MED ORDER — LACTATED RINGERS IV SOLN: INTRAVENOUS | Status: DC

## 2021-03-20 MED ORDER — FENTANYL CITRATE (PF) 100 MCG/2ML IJ SOLN: 25.0000 ug | INTRAMUSCULAR | Status: DC | PRN

## 2021-03-20 MED ORDER — ZOLPIDEM TARTRATE 5 MG PO TABS: 5.0000 mg | ORAL_TABLET | Freq: Every evening | ORAL | Status: DC | PRN

## 2021-03-20 MED ORDER — POVIDONE-IODINE 10 % EX SWAB: 2.0000 "application " | Freq: Once | CUTANEOUS | Status: DC

## 2021-03-20 MED ORDER — SCOPOLAMINE 1 MG/3DAYS TD PT72
MEDICATED_PATCH | TRANSDERMAL | Status: AC
  Filled 2021-03-20: qty 1

## 2021-03-20 MED ORDER — FENTANYL CITRATE (PF) 100 MCG/2ML IJ SOLN
INTRAMUSCULAR | Status: AC
  Filled 2021-03-20: qty 2

## 2021-03-20 MED ORDER — PHENYLEPHRINE HCL (PRESSORS) 10 MG/ML IV SOLN
INTRAVENOUS | Status: DC | PRN
  Administered 2021-03-20: 80 ug via INTRAVENOUS

## 2021-03-20 MED ORDER — FENTANYL CITRATE (PF) 100 MCG/2ML IJ SOLN
INTRAMUSCULAR | Status: DC | PRN
  Administered 2021-03-20: 15 ug via INTRATHECAL

## 2021-03-20 MED ORDER — NALOXONE HCL 0.4 MG/ML IJ SOLN: 0.4000 mg | INTRAMUSCULAR | Status: DC | PRN

## 2021-03-20 MED ORDER — KETOROLAC TROMETHAMINE 30 MG/ML IJ SOLN: 30.0000 mg | Freq: Four times a day (QID) | INTRAMUSCULAR | Status: AC | PRN

## 2021-03-20 MED ORDER — MORPHINE SULFATE (PF) 0.5 MG/ML IJ SOLN
INTRAMUSCULAR | Status: DC | PRN
  Administered 2021-03-20: .15 mg via INTRATHECAL

## 2021-03-20 MED ORDER — MEPERIDINE HCL 25 MG/ML IJ SOLN: 6.2500 mg | INTRAMUSCULAR | Status: DC | PRN

## 2021-03-20 MED ORDER — ONDANSETRON HCL 4 MG/2ML IJ SOLN: 4.0000 mg | INTRAMUSCULAR | Status: DC | PRN

## 2021-03-20 MED ORDER — MORPHINE SULFATE (PF) 0.5 MG/ML IJ SOLN
INTRAMUSCULAR | Status: AC
  Filled 2021-03-20: qty 10

## 2021-03-20 MED ORDER — KETOROLAC TROMETHAMINE 30 MG/ML IJ SOLN
INTRAMUSCULAR | Status: AC
  Filled 2021-03-20: qty 1

## 2021-03-20 MED ORDER — DIPHENHYDRAMINE HCL 50 MG/ML IJ SOLN: 12.5000 mg | INTRAMUSCULAR | Status: DC | PRN

## 2021-03-20 MED ORDER — SCOPOLAMINE 1 MG/3DAYS TD PT72
1.0000 | MEDICATED_PATCH | Freq: Once | TRANSDERMAL | Status: DC
  Administered 2021-03-20: 1.5 mg via TRANSDERMAL

## 2021-03-20 MED ORDER — METOCLOPRAMIDE HCL 5 MG/ML IJ SOLN
INTRAMUSCULAR | Status: DC | PRN
  Administered 2021-03-20: 10 mg via INTRAVENOUS

## 2021-03-20 MED ORDER — ONDANSETRON HCL 4 MG/2ML IJ SOLN
INTRAMUSCULAR | Status: DC | PRN
  Administered 2021-03-20: 4 mg via INTRAVENOUS

## 2021-03-20 MED ORDER — DEXAMETHASONE SODIUM PHOSPHATE 10 MG/ML IJ SOLN
INTRAMUSCULAR | Status: AC
  Filled 2021-03-20: qty 1

## 2021-03-20 MED ORDER — OXYCODONE HCL 5 MG PO TABS
5.0000 mg | ORAL_TABLET | ORAL | Status: DC | PRN
  Administered 2021-03-20 – 2021-03-22 (×3): 5 mg via ORAL
  Filled 2021-03-20 (×3): qty 1

## 2021-03-20 MED ORDER — SENNOSIDES-DOCUSATE SODIUM 8.6-50 MG PO TABS
2.0000 | ORAL_TABLET | Freq: Every day | ORAL | Status: DC
  Administered 2021-03-21 – 2021-03-22 (×2): 2 via ORAL
  Filled 2021-03-20 (×2): qty 2

## 2021-03-20 MED ORDER — SERTRALINE HCL 50 MG PO TABS
50.0000 mg | ORAL_TABLET | Freq: Every day | ORAL | Status: DC
  Administered 2021-03-20 – 2021-03-21 (×2): 50 mg via ORAL
  Filled 2021-03-20 (×2): qty 1

## 2021-03-20 MED ORDER — ACETAMINOPHEN 500 MG PO TABS
1000.0000 mg | ORAL_TABLET | Freq: Four times a day (QID) | ORAL | Status: AC
  Administered 2021-03-20 – 2021-03-21 (×3): 1000 mg via ORAL
  Filled 2021-03-20 (×3): qty 2

## 2021-03-20 MED ORDER — BENZOCAINE-MENTHOL 20-0.5 % EX AERO: 1.0000 "application " | INHALATION_SPRAY | CUTANEOUS | Status: DC | PRN

## 2021-03-20 SURGICAL SUPPLY — 36 items
BENZOIN TINCTURE PRP APPL 2/3 (GAUZE/BANDAGES/DRESSINGS) ×2 IMPLANT
CHLORAPREP W/TINT 26ML (MISCELLANEOUS) ×2 IMPLANT
CLAMP CORD UMBIL (MISCELLANEOUS) IMPLANT
CLOSURE STERI STRIP 1/2 X4 (GAUZE/BANDAGES/DRESSINGS) ×2 IMPLANT
CLOTH BEACON ORANGE TIMEOUT ST (SAFETY) ×2 IMPLANT
DRAPE C SECTION CLR SCREEN (DRAPES) ×2 IMPLANT
DRSG OPSITE POSTOP 4X10 (GAUZE/BANDAGES/DRESSINGS) ×2 IMPLANT
ELECT REM PT RETURN 9FT ADLT (ELECTROSURGICAL) ×2
ELECTRODE REM PT RTRN 9FT ADLT (ELECTROSURGICAL) ×1 IMPLANT
EXTRACTOR VACUUM KIWI (MISCELLANEOUS) IMPLANT
GLOVE BIO SURGEON STRL SZ 6.5 (GLOVE) ×2 IMPLANT
GLOVE BIOGEL PI IND STRL 7.0 (GLOVE) ×2 IMPLANT
GLOVE BIOGEL PI INDICATOR 7.0 (GLOVE) ×2
GOWN STRL REUS W/TWL LRG LVL3 (GOWN DISPOSABLE) ×4 IMPLANT
KIT ABG SYR 3ML LUER SLIP (SYRINGE) IMPLANT
NEEDLE HYPO 25X5/8 SAFETYGLIDE (NEEDLE) IMPLANT
NS IRRIG 1000ML POUR BTL (IV SOLUTION) ×2 IMPLANT
PACK C SECTION WH (CUSTOM PROCEDURE TRAY) ×2 IMPLANT
PAD OB MATERNITY 4.3X12.25 (PERSONAL CARE ITEMS) ×2 IMPLANT
RETRACTOR WND ALEXIS 25 LRG (MISCELLANEOUS) ×1 IMPLANT
RTRCTR C-SECT PINK 25CM LRG (MISCELLANEOUS) IMPLANT
RTRCTR WOUND ALEXIS 25CM LRG (MISCELLANEOUS) ×2
STRIP CLOSURE SKIN 1/2X4 (GAUZE/BANDAGES/DRESSINGS) IMPLANT
SUT CHROMIC 1 CTX 36 (SUTURE) ×4 IMPLANT
SUT PLAIN 0 NONE (SUTURE) IMPLANT
SUT PLAIN 2 0 XLH (SUTURE) ×2 IMPLANT
SUT VIC AB 0 CT1 27 (SUTURE) ×2
SUT VIC AB 0 CT1 27XBRD ANBCTR (SUTURE) ×2 IMPLANT
SUT VIC AB 2-0 CT1 27 (SUTURE) ×1
SUT VIC AB 2-0 CT1 TAPERPNT 27 (SUTURE) ×1 IMPLANT
SUT VIC AB 3-0 CT1 27 (SUTURE)
SUT VIC AB 3-0 CT1 TAPERPNT 27 (SUTURE) IMPLANT
SUT VIC AB 4-0 KS 27 (SUTURE) ×2 IMPLANT
TOWEL OR 17X24 6PK STRL BLUE (TOWEL DISPOSABLE) ×2 IMPLANT
TRAY FOLEY W/BAG SLVR 14FR LF (SET/KITS/TRAYS/PACK) ×2 IMPLANT
WATER STERILE IRR 1000ML POUR (IV SOLUTION) ×2 IMPLANT

## 2021-03-20 NOTE — Addendum Note (Signed)
Addendum  created 03/20/21 1420 by Lenox Ahr, CRNA   Cosign clinical note

## 2021-03-20 NOTE — Lactation Note (Addendum)
This note was copied from a baby's chart. Lactation Consultation Note  Patient Name: Pamela Harper [id number] Date: 03/20/2021 Reason for consult: Initial assessment;Mother's request;Difficult latch;Term;Breastfeeding assistance;RN request (Zoloft)  Age:36 hours  LC worked with Mom to get infant latched with audible swallows heard in football position. Mom had compression stripe but no complaints of nipple pain. RN, Bryson Dames to provide coconut oil for nipple care.   Plan 1. To feed based on cues 8-12x 24 hr period. Mom to offer breasts and look for signs of milk transfer.  2. If unable to latch, Mom give colostrum from spoon via hand expression.  3. I and O sheet reviewed.  All questions answered at the end of the visit.    Maternal Data Has patient been taught Hand Expression?: Yes Does the patient have breastfeeding experience prior to this delivery?: Yes How long did the patient breastfeed?: last child for 26 months  Feeding Mother's Current Feeding Choice: Breast Milk  LATCH Score Latch: Repeated attempts needed to sustain latch, nipple held in mouth throughout feeding, stimulation needed to elicit sucking reflex.  Audible Swallowing: Spontaneous and intermittent  Type of Nipple: Everted at rest and after stimulation  Comfort (Breast/Nipple): Soft / non-tender  Hold (Positioning): Assistance needed to correctly position infant at breast and maintain latch.  LATCH Score: 8   Lactation Tools Discussed/Used Tools: Flanges;Pump;Coconut oil Flange Size: 27 Breast pump type: Manual (Infant latched well, Mom may not use pump for stimulation at this time. Just set up and reviewed if needed.) Pump Education: Setup, frequency, and cleaning;Milk Storage  Interventions Interventions: Breast feeding basics reviewed;Support pillows;Education;Assisted with latch;Position options;Skin to skin;Expressed milk;Breast massage;Coconut oil;LC Services brochure;Infant Driven  Feeding Algorithm education;Hand pump;Breast compression;Adjust position;Hand express  Discharge Pump: Personal  Consult Status Consult Status: Follow-up Date: 03/21/21 Follow-up type: In-patient    Mima Cranmore  Nicholson-Springer 03/20/2021, 4:08 PM

## 2021-03-20 NOTE — H&P (Signed)
Pamela Harper is a 35 y.o.G54P2002 female presenting for elective repeat cesarean section ( x2) at  82 0/7wks. Pt is dated per LMP which wa confirmed with a 9 week [place]. She had extensive vaginal lacerations after delivering her first child vaginally so opted for repeat c/s with second and now this pregnancy.  She is AMA. Anxiety has been well controlled on zoloft. Low lying placenta resolved. Low risk materniT21. GBS neg OB History     Gravida  3   Para  2   Term      Preterm      AB      Living  2      SAB      IAB      Ectopic      Multiple      Live Births             Past Medical History:  Diagnosis Date   Depression    Seasonal allergies    Past Surgical History:  Procedure Laterality Date   CESAREAN SECTION  2019   CHOLECYSTECTOMY  2008   PERINEAL LACERATION REPAIR     Family History: family history includes Anxiety disorder in her mother and sister; Asthma in her mother; Bipolar disorder in her mother; Cancer in her paternal grandfather; Depression in her father and mother; Diabetes in her mother and paternal grandmother; Hyperlipidemia in her paternal grandmother; Hypertension in her father and paternal grandmother; Hyperthyroidism in her father; Hypothyroidism in her mother; Macular degeneration in her maternal grandfather and mother; Multiple sclerosis in her father; Sleep apnea in her father and mother. Social History:  reports that she has never smoked. She has never used smokeless tobacco. She reports current alcohol use. She reports that she does not use drugs.     Maternal Diabetes: No Genetic Screening: Normal Maternal Ultrasounds/Referrals: Normal Fetal Ultrasounds or other Referrals:  None Maternal Substance Abuse:  No Significant Maternal Medications:  None Significant Maternal Lab Results:  Group B Strep negative Other Comments:  None  Review of Systems  Constitutional:  Negative for activity change and fatigue.  Eyes:  Negative for  photophobia and visual disturbance.  Respiratory:  Negative for chest tightness and shortness of breath.   Cardiovascular:  Positive for leg swelling. Negative for chest pain and palpitations.  Gastrointestinal:  Negative for abdominal pain.  Genitourinary:  Negative for vaginal pain.  Musculoskeletal:  Positive for back pain.  Neurological:  Negative for seizures, light-headedness and headaches.  Psychiatric/Behavioral:  The patient is not nervous/anxious.   Maternal Medical History:  Reason for admission: Scheduled repeat cesarean section  Contractions: Frequency: rare.   Fetal activity: Perceived fetal activity is normal.   Prenatal complications: no prenatal complications Prenatal Complications - Diabetes: none.    Blood pressure 106/75, pulse (!) 103, temperature 98.1 F (36.7 C), temperature source Oral, resp. rate 18, height 5\' 5"  (1.651 m), weight 90.7 kg, last menstrual period 06/20/2020, SpO2 100 %. Maternal Exam:  Uterine Assessment: Contraction strength is mild.  Abdomen: Patient reports generalized tenderness.  Estimated fetal weight is AGA.   Fetal presentation: vertex Introitus: not evaluated.    Physical Exam Vitals and nursing note reviewed. Exam conducted with a chaperone present.  Constitutional:      Appearance: Normal appearance.  Pulmonary:     Effort: Pulmonary effort is normal.  Abdominal:     Tenderness: There is generalized abdominal tenderness.  Musculoskeletal:     Cervical back: Normal range of motion.  Skin:  Capillary Refill: Capillary refill takes 2 to 3 seconds.  Neurological:     General: No focal deficit present.     Mental Status: She is alert and oriented to person, place, and time. Mental status is at baseline.  Psychiatric:        Mood and Affect: Mood normal.        Behavior: Behavior normal.        Thought Content: Thought content normal.        Judgment: Judgment normal.    Prenatal labs: ABO, Rh: --/--/A POS (10/14  0935) Antibody: NEG (10/14 0935) Rubella: Immune (03/29 0000) RPR: NON REACTIVE (10/14 0935)  HBsAg: Negative (03/29 0000)  HIV: Non-reactive (03/29 0000)  GBS: Negative/-- (09/28 0000)   Assessment/Plan: [id number] [id number] female at 3 0/[redacted]wks gestation here for elective repeat cesarean section - Admit - ERAS protocol - GBS neg - Review expectations and obtain consent - To OR when ready    Sharnika Binney W Alleen Kehm 03/20/2021, 9:51 AM

## 2021-03-20 NOTE — Anesthesia Procedure Notes (Signed)
Spinal  Patient location during procedure: OR Start time: 03/20/2021 10:06 AM End time: 03/20/2021 10:09 AM Reason for block: surgical anesthesia Staffing Performed: anesthesiologist  Anesthesiologist: Leilani Able, MD Preanesthetic Checklist Completed: patient identified, IV checked, site marked, risks and benefits discussed, surgical consent, monitors and equipment checked, pre-op evaluation and timeout performed Spinal Block Patient position: sitting Prep: DuraPrep and site prepped and draped Patient monitoring: continuous pulse ox and blood pressure Approach: midline Location: L3-4 Injection technique: single-shot Needle Needle type: Pencan  Needle gauge: 24 G Needle length: 10 cm Needle insertion depth: 6 cm Assessment Sensory level: T4 Events: paresthesia and CSF return Additional Notes Midline back. Patient dosed after paresthesia subsided.

## 2021-03-20 NOTE — Transfer of Care (Signed)
Immediate Anesthesia Transfer of Care Note  Patient: Pamela Harper  Procedure(s) Performed: CESAREAN SECTION  Patient Location: PACU  Anesthesia Type:Spinal  Level of Consciousness: awake, alert  and oriented  Airway & Oxygen Therapy: Patient Spontanous Breathing  Post-op Assessment: Report given to RN and Post -op Vital signs reviewed and stable  Post vital signs: Reviewed and stable  Last Vitals:  Vitals Value Taken Time  BP 103/46 03/20/21 1108  Temp    Pulse 76 03/20/21 1110  Resp 22 03/20/21 1110  SpO2 100 % 03/20/21 1110  Vitals shown include unvalidated device data.  Last Pain:  Vitals:   03/20/21 0838  TempSrc: Oral         Complications: No notable events documented.

## 2021-03-20 NOTE — Social Work (Signed)
[name] was referred for history of depression/anxiety.  * Referral screened out by Clinical Social Worker because none of the following criteria appear to apply: ~ History of anxiety/depression during this pregnancy, or of post-partum depression following prior delivery. ~ Diagnosis of anxiety and/or depression within last 3 years OR * [name]'s symptoms currently being treated with medication and/or therapy. Per chart review, [name] actively takes Zoloft for symptoms and sees a therapist.   Please contact the Clinical Social Worker if needs arise, by [name] request, or if [name] scores greater than 9/yes to question 10 on Edinburgh Postpartum Depression Screen.   Pamela Harper, MSW, LCSW [facility]  Clinical Social Worker  [phone] 03/20/2021  3:45 PM  

## 2021-03-20 NOTE — Op Note (Signed)
Operative Note    Preoperative Diagnosis IUP at [redacted] weeks gestation Elective repeat cesarean section   Postoperative Diagnosis: Same   Procedure: Repeat low transverse cesarean section with single layered closure   Surgeon: Britt Bottom  DO Assist: Ellender Hose  Anesthesia: Daylene Posey MD; Dorena Bodo CRNA   Fluids: LR EBL: UOP:   Findings: Viable female infant in vertex position, APGARS 8,8, Weight pending Grossly normal uterus, tubes and ovaries. Thin lower uterine segment   Specimen: Placenta to L/D - donated   Procedure Note Consent verified pre-op. All questions answered   Patient was taken to the operating room where spinal anesthesia was administered. She was prepped and draped in the normal sterile fashion and placed in the dorsal supine position with a leftward tilt. An appropriate time out was performed. A Pfannenstiel skin incision was then made through the previous incision with the scalpel and carried through to the underlying layer of fascia by sharp dissection and Bovie cautery. The fascia was nicked in the midline and the incision was extended laterally with Mayo scissors. The superior and inferior aspects of the incision were grasped kocher clamps and dissected off the underlying rectus muscles.Rectus muscles were separated in the midline  and the peritoneal cavity entered bluntly. The peritoneal incision was then extended both superiorly and inferiorly with careful attention to avoid both bowel and bladder. The Alexis self-retaining wound retractor was then placed and the lower uterine segment exposed. The bladder flap was developed with Metzenbaum scissors and pushed away from the lower uterine segment. The lower uterine segment was then incised in a transverse fashion and the cavity itself entered bluntly. Copious clear amniotic fluid noted. The incision was extended bluntly. The infant's head was then lifted and delivered from the incision without  difficulty. Vigorous spontaneous cry was noted during delivery.  The remainder of the infant delivered and the nose and mouth bulb suctioned. The cord was clamped and cut after a minute delay. The infant was handed off to the waiting NICU staff. Cord blood was obtained.  The placenta was then spontaneously expressed from the uterus and the uterus cleared of all clots and debris with moist lap sponge. The uterine incision was then repaired in a single layer was a running layer 1-0 chromic . Due to the thin nature of the lower uterine segment a second imbricating layer was not done. The tubes and ovaries were inspected and noted to be grossly norma.l. The gutters were cleared of all clots and debris. The uterine incision was inspected and found to be hemostatic. All instruments and sponges were then removed from the abdomen. The peritoneum and rectus muscles were then reapproximated in a running fashion with sutures of 2-0 Vicryl. The fascia was then closed with 0 Vicryl in a running fashion. Subcutaneous tissue was reapproximated with 3-0 plain in a running fashion. The skin was closed with a subcuticular stitch of 4-0 Vicryl on a Keith needle and then reinforced with benzoin and Steri-Strips. At the conclusion of the procedure all instruments and sponge counts were correct. Patient was taken to the recovery room in good condition with her baby accompanying her skin to skin.

## 2021-03-20 NOTE — Anesthesia Preprocedure Evaluation (Signed)
Anesthesia Evaluation  Patient identified by MRN, date of birth, ID band Patient awake    Reviewed: Allergy & Precautions, H&P , NPO status , Patient's Chart, lab work & pertinent test results  Airway Mallampati: II  TM Distance: >3 FB Neck ROM: full    Dental no notable dental hx.    Pulmonary neg pulmonary ROS,    Pulmonary exam normal breath sounds clear to auscultation       Cardiovascular negative cardio ROS Normal cardiovascular exam Rhythm:regular Rate:Normal     Neuro/Psych negative neurological ROS  negative psych ROS   GI/Hepatic negative GI ROS, Neg liver ROS,   Endo/Other  negative endocrine ROS  Renal/GU negative Renal ROS  negative genitourinary   Musculoskeletal negative musculoskeletal ROS (+)   Abdominal (+) + obese,   Peds  Hematology negative hematology ROS (+)   Anesthesia Other Findings   Reproductive/Obstetrics (+) Pregnancy                             Anesthesia Physical Anesthesia Plan  ASA: 2  Anesthesia Plan:    Post-op Pain Management:    Induction:   PONV Risk Score and Plan: 3 and Scopolamine patch - Pre-op, Dexamethasone and Ondansetron  Airway Management Planned: Nasal Cannula, Natural Airway and Simple Face Mask  Additional Equipment: None  Intra-op Plan:   Post-operative Plan:   Informed Consent: I have reviewed the patients History and Physical, chart, labs and discussed the procedure including the risks, benefits and alternatives for the proposed anesthesia with the patient or authorized representative who has indicated his/her understanding and acceptance.       Plan Discussed with: CRNA  Anesthesia Plan Comments:         Anesthesia Quick Evaluation

## 2021-03-20 NOTE — Anesthesia Postprocedure Evaluation (Signed)
Anesthesia Post Note  Patient: Pamela Harper  Procedure(s) Performed: CESAREAN SECTION     Patient location during evaluation: Phase II Anesthesia Type: Spinal Level of consciousness: awake Pain management: pain level controlled Vital Signs Assessment: post-procedure vital signs reviewed and stable Respiratory status: spontaneous breathing Cardiovascular status: stable Postop Assessment: no apparent nausea or vomiting, no headache, no backache, spinal receding and patient able to bend at knees Anesthetic complications: no   No notable events documented.  Last Vitals:  Vitals:   03/20/21 1200 03/20/21 1213  BP: (!) 89/64 (!) 105/43  Pulse: 61 64  Resp: 16   Temp:  36.9 C  SpO2:  100%    Last Pain:  Vitals:   03/20/21 1213  TempSrc: Oral                 Caren Macadam

## 2021-03-21 LAB — CBC
HCT: 30.9 % — ABNORMAL LOW (ref 36.0–46.0)
Hemoglobin: 9.9 g/dL — ABNORMAL LOW (ref 12.0–15.0)
MCH: 24.4 pg — ABNORMAL LOW (ref 26.0–34.0)
MCHC: 32 g/dL (ref 30.0–36.0)
MCV: 76.1 fL — ABNORMAL LOW (ref 80.0–100.0)
Platelets: 276 K/uL (ref 150–400)
RBC: 4.06 MIL/uL (ref 3.87–5.11)
RDW: 14.6 % (ref 11.5–15.5)
WBC: 19.5 K/uL — ABNORMAL HIGH (ref 4.0–10.5)
nRBC: 0 % (ref 0.0–0.2)

## 2021-03-21 LAB — BIRTH TISSUE RECOVERY COLLECTION (PLACENTA DONATION)

## 2021-03-21 NOTE — Plan of Care (Signed)
Progressing well.

## 2021-03-21 NOTE — Progress Notes (Signed)
  Patient is eating, ambulating, voiding.  Pain control is good.  Vitals:   03/20/21 1450 03/20/21 1746 03/21/21 0136 03/21/21 0552  BP: (!) 100/56 (!) 102/52 (!) 96/59 (!) 99/51  Pulse: (!) 59 64 61 65  Resp: 17 18    Temp: 97.9 F (36.6 C) 97.9 F (36.6 C)    TempSrc: Oral Oral    SpO2: 96% 98%    Weight:      Height:        lungs:   clear to auscultation cor:    RRR Abdomen:  soft, appropriate tenderness, incisions intact and without erythema or exudate ex:    no cords   Lab Results  Component Value Date   WBC 19.5 (H) 03/21/2021   HGB 9.9 (L) 03/21/2021   HCT 30.9 (L) 03/21/2021   MCV 76.1 (L) 03/21/2021   PLT 276 03/21/2021    --/--/A POS (10/14 0935)/RI  A/P    Post operative day 1.  Routine post op and postpartum care.  Expect d/c tomorrow.   Parents desires circumsision.  All risks, benefits and alternatives discussed with the mother.   Oxycodone for pain control.

## 2021-03-21 NOTE — Lactation Note (Signed)
This note was copied from a baby's chart. Lactation Consultation Note  Patient Name: Boy Cheryl Chay [id number] Date: 03/21/2021 Reason for consult: Follow-up assessment Age:36 hours Mother paged for latch observation. Infant had been breastfeeding for 20 mins when Lc arrived to the room. Infant released the breast and observed that nipple was pinched along the midline. Mother reports a crack on the left nipple at the shaft . She has been using football hold on the left breast consistently. Mothers nipples are very pink and tender.  Advised mother to rotate positions and get deeper latch.  Assist mother to positioning herself in chair with her breastfriend pillow.  Infant latched on both breast for 10 mins in cross cradle hold. And football hold. Each time latch was adjusted with flipping his lip and tugging on his chin.  Mother taught to latch with a off sided latch.  Latch was still leaving a ridge at the midline of the nipple.  Mother reports that she has a LC that will be coming to her home to assist her everyday.  I didn't do a tongue evaluation. Infant was still hungry. We hand expressed and obtained only 1 ml. And spoon fed this. Infant was placed STS with father.  Suggested that mother pump with hand pump. Infant may continue to cluster feed at this hour.  Informed mother of available DMB. Mother reports that she would rather not use anything but her own milk.  Mother was given comfort gels. She is to page [facility] as needed.  Maternal Data    Feeding Mother's Current Feeding Choice: Breast Milk  LATCH Score                    Lactation Tools Discussed/Used    Interventions    Discharge    Consult Status      Michel Bickers 03/21/2021, 11:11 AM

## 2021-03-21 NOTE — Progress Notes (Signed)
Penis of infant examined and there is too short of a distance between the scrotum and corona on the ventral side.  Will not do circ for fear of scarring.  D/w with parents.

## 2021-03-22 ENCOUNTER — Other Ambulatory Visit (HOSPITAL_COMMUNITY): Payer: Self-pay

## 2021-03-22 MED ORDER — OXYCODONE HCL 5 MG PO TABS
5.0000 mg | ORAL_TABLET | Freq: Four times a day (QID) | ORAL | 0 refills | Status: DC | PRN
  Filled 2021-03-22: qty 20, 3d supply, fill #0

## 2021-03-22 MED ORDER — IBUPROFEN 600 MG PO TABS
600.0000 mg | ORAL_TABLET | Freq: Four times a day (QID) | ORAL | 0 refills | Status: DC
  Filled 2021-03-22: qty 30, 8d supply, fill #0

## 2021-03-22 NOTE — Lactation Note (Signed)
This note was copied from a baby's chart. Lactation Consultation Note  Patient Name: Pamela Harper [id number] Date: 03/22/2021 Reason for consult: Follow-up assessment;Nipple pain/trauma;Term Age:36 hours  LC in to visit with P3 Mom of term infant on day of discharge. Baby at 7.7% weight loss with adequate output.  Mom holding baby sucking on a pacifier.  Mom having pinching and blistering of nipples.  Baby cluster fed during the night.  Using coconut oil and Comfort Gels alternatively.   Talked about pacifier use during early days of breastfeeding. Baby cueing and wanting to eat.    Oral assessment- labial frenulum and possible posterior short frenulum as baby not able to cup finger on suck assessment.  Tongue elevated in rear and tongue popping on and off finger.  Palate normal.  LC watched as Mom latched baby.  LC offered some advice supporting breast and not leaning into baby, but bringing baby to her.  Showed Mom and FOB how to assess lower lip and gape of mouth and how to open mouth wider which really helped with pinching.  Lots of education on importance of baby being held in closely and not pulling breast away from baby's mouth or nose.   Baby latched and fed on both breasts using football and cross cradle holds.  Lots of basic teaching reviewed.  Mom desires OP lactation F/U.   Pediatrician appt 10/20 Home visit from [facility] on 10/21.  Engorgement prevention and treatment reviewed. Mom aware of OP lactation support available to her and encouraged to call prn.  LATCH Score Latch: Grasps breast easily, tongue down, lips flanged, rhythmical sucking.  Audible Swallowing: Spontaneous and intermittent  Type of Nipple: Everted at rest and after stimulation  Comfort (Breast/Nipple): Filling, red/small blisters or bruises, mild/mod discomfort  Hold (Positioning): Assistance needed to correctly position infant at breast and maintain latch.  LATCH Score:  8   Interventions Interventions: Breast feeding basics reviewed;Assisted with latch;Skin to skin;Breast massage;Hand express;Breast compression;Adjust position;Support pillows;Position options;Comfort gels;Shells;Education  Discharge Discharge Education: Engorgement and breast care;Outpatient recommendation;Outpatient Epic message sent [facility] Program: No  Consult Status Consult Status: Complete Date: 03/22/21 Follow-up type: Call as needed    [name] 03/22/2021, 1:50 PM

## 2021-03-22 NOTE — Progress Notes (Signed)
Patient ambulated in the hallway and tolerated well. Denied dizziness or lightheadedness. She is feeling better and desires discharge.

## 2021-03-22 NOTE — Discharge Summary (Signed)
Postpartum Discharge Summary  Date of Service updated 03/22/21     Patient Name: Pamela Harper DOB: 1986-01-04 MRN: [id number]  Date of admission: 03/20/2021 Delivery date:03/20/2021  Delivering provider: Sherlyn Hay  Date of discharge: 03/22/2021  Admitting diagnosis: Previous cesarean delivery affecting pregnancy [O34.219] Intrauterine pregnancy: [redacted]w[redacted]d    Secondary diagnosis:  Active Problems:   Previous cesarean delivery affecting pregnancy  Additional problems: anxiety on zoloft    Discharge diagnosis: Term Pregnancy Delivered                                              Post partum procedures: n/a Augmentation: N/A Complications: None  Hospital course: Sceduled C/S   35 y.o. yo G3P1003 at 35w0das admitted to the hospital 03/20/2021 for scheduled cesarean section with the following indication:Elective Repeat.Delivery details are as follows:  Membrane Rupture Time/Date: 10:27 AM ,03/20/2021   Delivery Method:C-Section, Low Transverse  Details of operation can be found in separate operative note.  Patient had an uncomplicated postpartum course.  She is ambulating, tolerating a regular diet, passing flatus, and urinating well. Patient is discharged home in stable condition on  03/22/21        Newborn Data: Birth date:03/20/2021  Birth time:10:27 AM  Gender:Female  Living status:Living  Apgars:9 ,10  Weight:3980 g       Physical exam  Vitals:   03/21/21 0552 03/21/21 1408 03/21/21 2002 03/22/21 0510  BP: (!) 99/51 (!) 91/44 (!) 98/57 102/65  Pulse: 65 78 71 76  Resp:  _0 Temp:  98.9 F (37.2 C) 97.8 F (36.6 C) 97.8 F (36.6 C)  TempSrc:  Oral Oral Oral  SpO2:  97% 98%   Weight:      Height:       General: alert, cooperative, and no distress Lochia: appropriate Uterine Fundus: firm Incision: Healing well with no significant drainage DVT Evaluation: No evidence of DVT seen on physical exam. Labs: Lab Results  Component Value  Date   WBC 19.5 (H) 03/21/2021   HGB 9.9 (L) 03/21/2021   HCT 30.9 (L) 03/21/2021   MCV 76.1 (L) 03/21/2021   PLT 276 03/21/2021   CMP Latest Ref Rng & Units 11/24/2019  Glucose 70 - 99 mg/dL 94  BUN 6 - 23 mg/dL 16  Creatinine 0.40 - 1.20 mg/dL 0.85  Sodium 135 - 145 mEq/L 136  Potassium 3.5 - 5.1 mEq/L 4.3  Chloride 96 - 112 mEq/L 102  CO2 19 - 32 mEq/L 27  Calcium 8.4 - 10.5 mg/dL 9.7  Total Protein 6.0 - 8.3 g/dL 8.2  Total Bilirubin 0.2 - 1.2 mg/dL 0.6  Alkaline Phos 39 - 117 U/L 59  AST 0 - 37 U/L 16  ALT 0 - 35 U/L 16   Edinburgh Score: Edinburgh Postnatal Depression Scale Screening Tool 03/22/2021  I have been able to laugh and see the funny side of things. 0  I have looked forward with enjoyment to things. 0  I have blamed myself unnecessarily when things went wrong. 0  I have been anxious or worried for no good reason. 0  I have felt scared or panicky for no good reason. 0  Things have been getting on top of me. 1  I have been so unhappy that I have had difficulty sleeping. 0  I have felt sad or  miserable. 0  I have been so unhappy that I have been crying. 0  The thought of harming myself has occurred to me. 0  Edinburgh Postnatal Depression Scale Total 1      After visit meds:  Allergies as of 03/22/2021       Reactions   Codeine Hives        Medication List     STOP taking these medications    aspirin EC 81 MG tablet   freestyle lancets   FREESTYLE LITE test strip Generic drug: glucose blood   FreeStyle Lite w/Device Kit   PreNatal DHA 200 MG Caps Generic drug: Docosahexaenoic Acid       TAKE these medications    ibuprofen 600 MG tablet Commonly known as: ADVIL Take 1 tablet (600 mg total) by mouth every 6 (six) hours.   oxyCODONE 5 MG immediate release tablet Commonly known as: Oxy IR/ROXICODONE Take 1-2 tablets (5-10 mg total) by mouth every 6 (six) hours as needed for severe pain.   prenatal multivitamin Tabs tablet Take 1  tablet by mouth daily.   sertraline 50 MG tablet Commonly known as: ZOLOFT Take 1 tablet (50 mg total) by mouth at bedtime.   TUMS ULTRA 1000 PO Take 1,000-2,000 mg by mouth daily as needed (heartburn).         Discharge home in stable condition Infant Feeding: Breast Infant Disposition:home with mother Discharge instruction: per After Visit Summary and Postpartum booklet. Activity: Advance as tolerated. Pelvic rest for 6 weeks.  Diet: routine diet Anticipated Birth Control: Unsure Postpartum Appointment:6 weeks Additional Postpartum F/U: Incision check 3 weeks Future Appointments:No future appointments. Follow up Visit:  [place], DO Follow up in 3 week(s).   Specialty: Obstetrics and Gynecology Contact information: [address] [place] [address] [phone]                     03/22/2021 [facility] Lars Masson, MD

## 2021-04-03 ENCOUNTER — Telehealth (HOSPITAL_COMMUNITY): Payer: Self-pay | Admitting: *Deleted

## 2021-04-03 NOTE — Telephone Encounter (Signed)
Mom reports feeling good. Incision healing well. No concerns about herself. EPDS in hospital=1. Declined EPDS now. Reports she's on Zoloft and feeling great. Mom reports baby is doing well. Feeding, peeing, and pooping without difficulty. Sleeps on back in bassinet. Reviewed safe sleep. Mom has no concerns about baby at this time.  Duffy Rhody, RN 04-03-2021 at 10:20am

## 2021-05-02 DIAGNOSIS — Z1389 Encounter for screening for other disorder: Secondary | ICD-10-CM | POA: Diagnosis not present

## 2021-05-02 DIAGNOSIS — Z3009 Encounter for other general counseling and advice on contraception: Secondary | ICD-10-CM | POA: Diagnosis not present

## 2021-05-15 ENCOUNTER — Other Ambulatory Visit (HOSPITAL_COMMUNITY): Payer: Self-pay

## 2021-05-15 ENCOUNTER — Other Ambulatory Visit: Payer: Self-pay | Admitting: Physician Assistant

## 2021-05-15 MED ORDER — SERTRALINE HCL 50 MG PO TABS
50.0000 mg | ORAL_TABLET | Freq: Every day | ORAL | 0 refills | Status: DC
  Filled 2021-05-15: qty 90, 90d supply, fill #0

## 2021-06-12 IMAGING — US US PELVIS COMPLETE WITH TRANSVAGINAL
1 series · 14 of 25 positions shown · non-contrast
Comparison: None

CLINICAL DATA: Pelvic pain and tenderness on LEFT, LMP 09/26/2019
(ongoing)



[Series 1: us pelvis complete with transvaginal · 0.25mm/px · 14 of 77 slices shown]
[im 1/77]
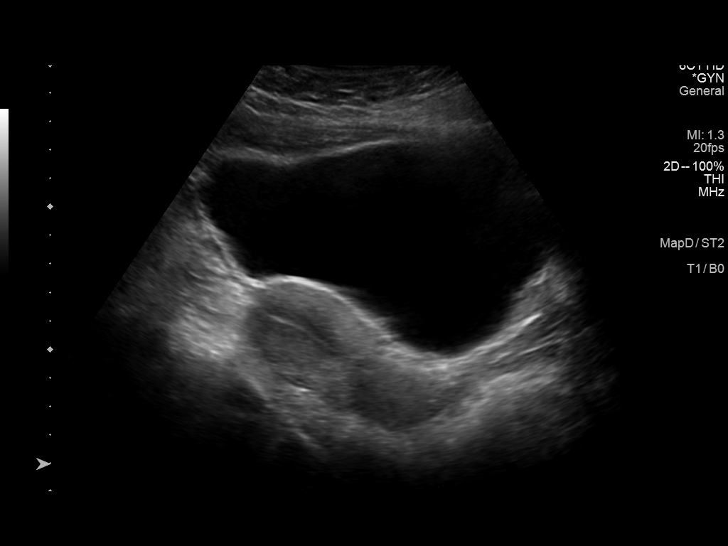
[im 7/77]
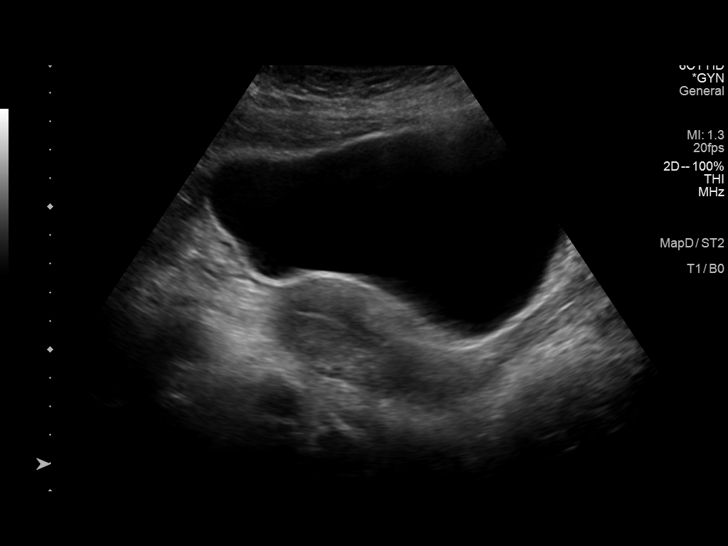
[im 13/77]
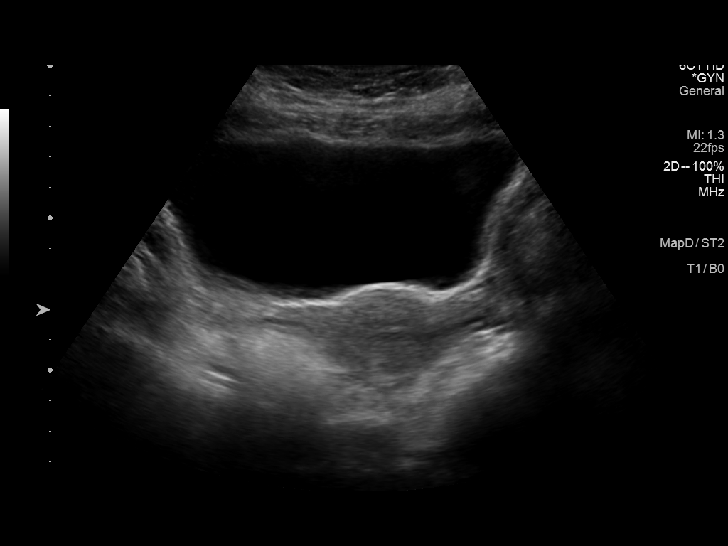
[im 20/77]
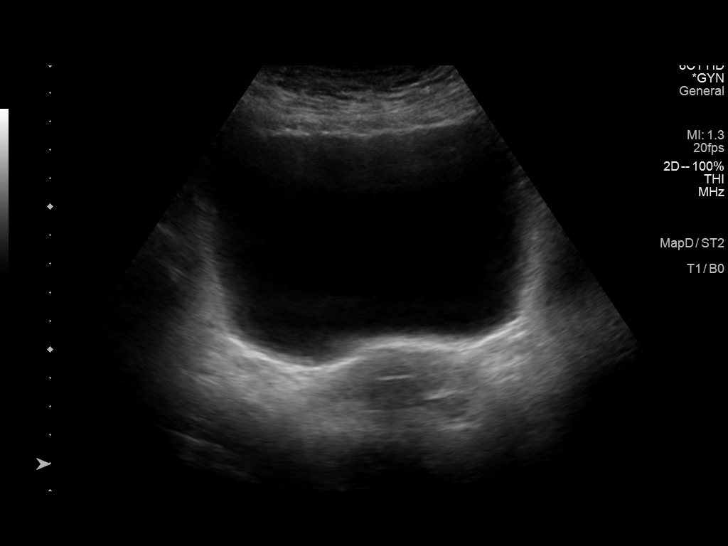
[im 26/77]
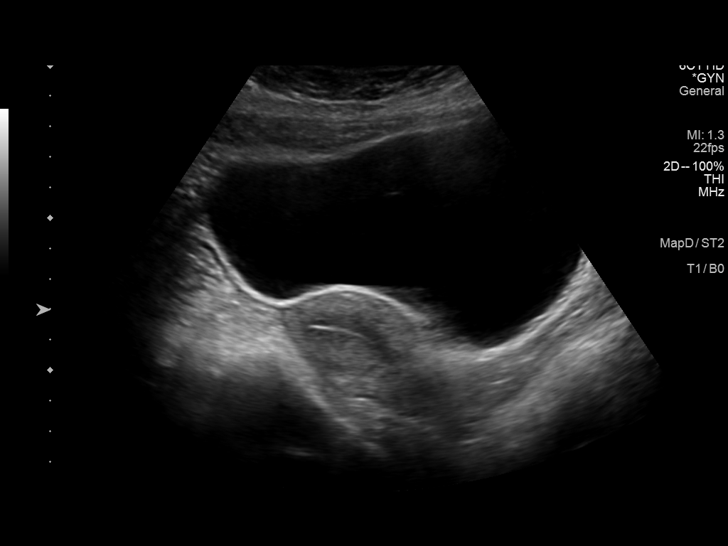
[im 29/77]
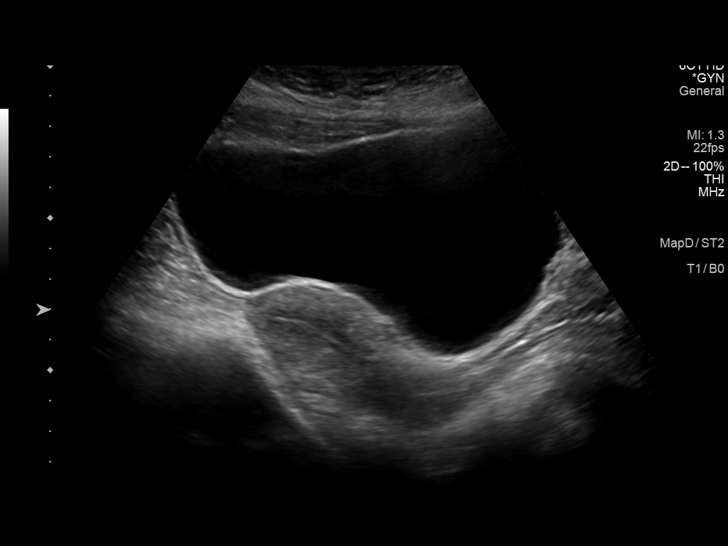
[im 35/77]
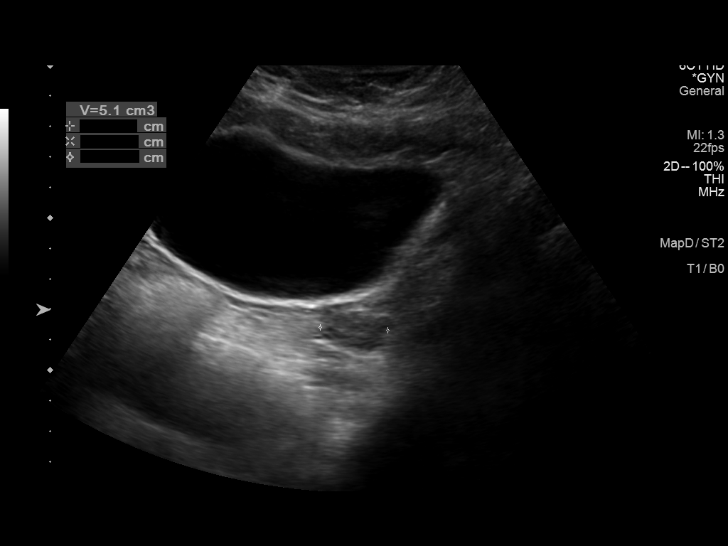
[im 42/77]
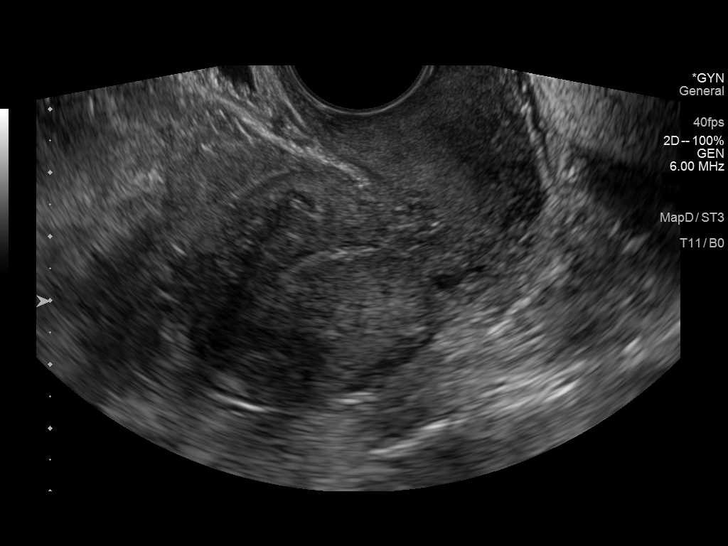
[im 48/77]
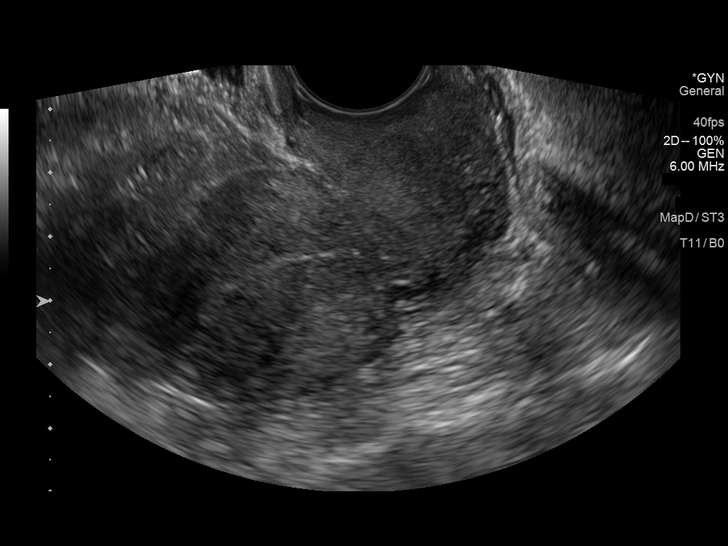
[im 51/77]
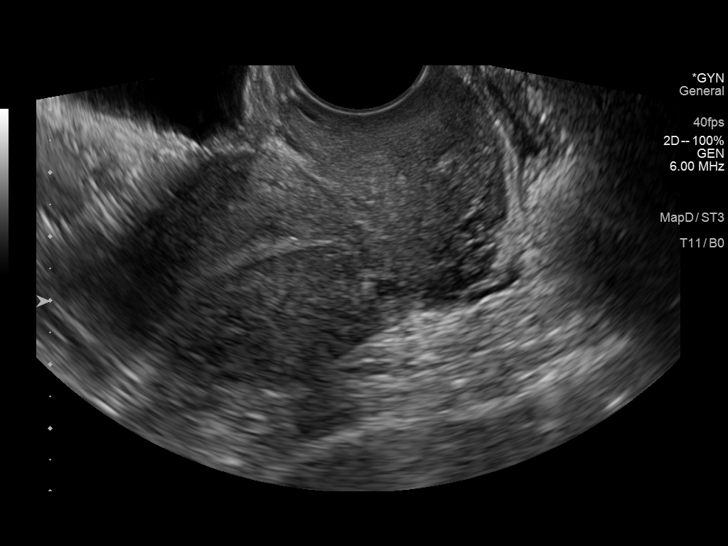
[im 58/77]
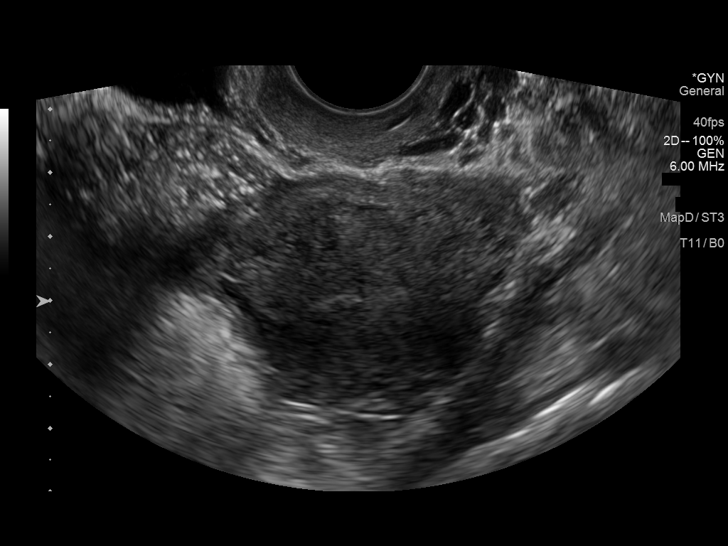
[im 64/77]
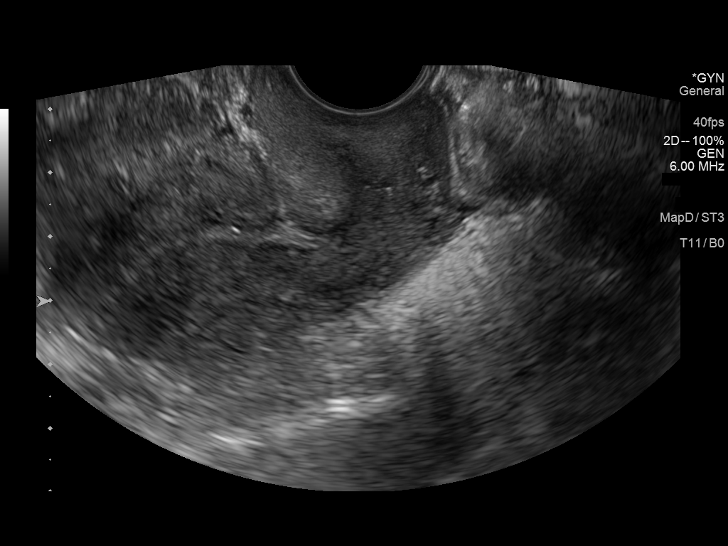
[im 70/77]
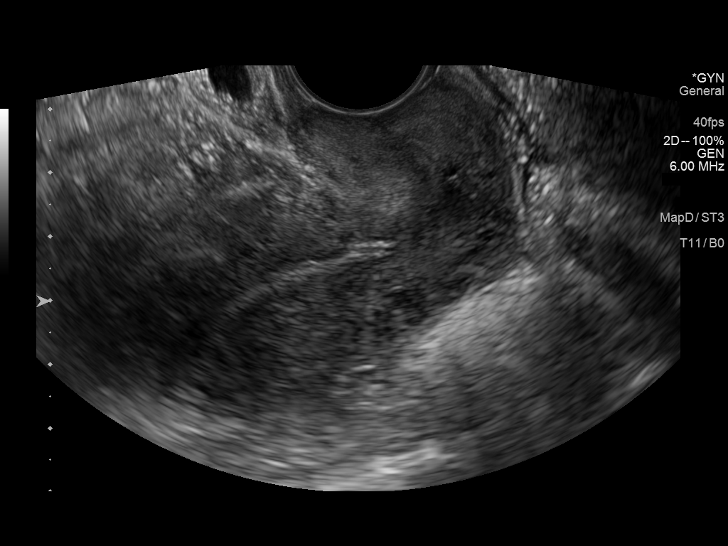
[im 77/77]
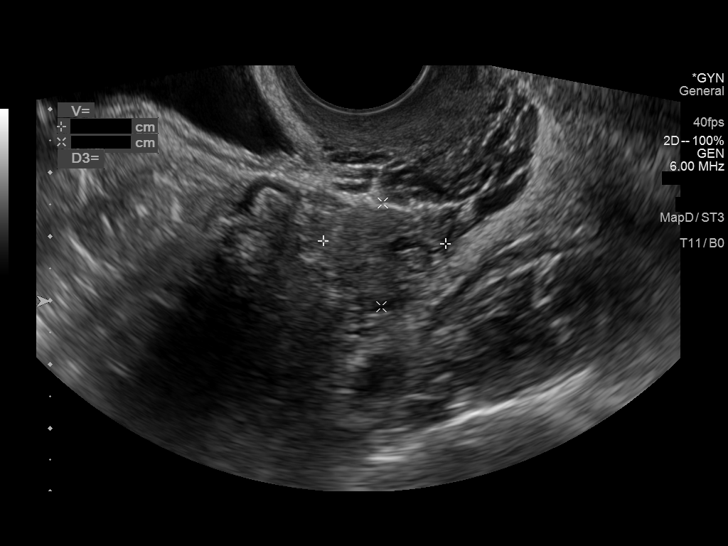

[14 of 25 positions shown; findings below may reference images not displayed]

FINDINGS: Uterus

Measurements: 7.8 x 3.3 x 5.8 cm = volume: 58 mL. Anteverted.
Anterior wall Caesarean section scar. Mildly heterogeneous
myometrium. No focal mass.

Endometrium

Thickness: 8 mm.  No endometrial fluid or focal abnormality

Right ovary

Measurements: 3.7 x 1.3 x 2.4 cm = volume: 6.1 mL. Normal morphology
without mass

Left ovary

Measurements: 3.3 x 1.3 x 2.2 cm = volume: 5.1 mL. Normal morphology
without mass

Other findings

Trace free pelvic fluid.  No adnexal masses.
IMPRESSION: No pelvic sonographic abnormalities.

## 2021-08-31 ENCOUNTER — Other Ambulatory Visit (HOSPITAL_COMMUNITY): Payer: Self-pay

## 2021-08-31 ENCOUNTER — Other Ambulatory Visit: Payer: Self-pay | Admitting: Physician Assistant

## 2021-08-31 MED ORDER — SERTRALINE HCL 50 MG PO TABS
50.0000 mg | ORAL_TABLET | Freq: Every day | ORAL | 0 refills | Status: DC
  Filled 2021-08-31: qty 90, 90d supply, fill #0

## 2021-09-12 ENCOUNTER — Encounter: Payer: Self-pay | Admitting: Physician Assistant

## 2021-09-12 ENCOUNTER — Ambulatory Visit (INDEPENDENT_AMBULATORY_CARE_PROVIDER_SITE_OTHER): Payer: 59 | Admitting: Physician Assistant

## 2021-09-12 VITALS — BP 100/62 | HR 76 | Temp 97.9°F | Ht 65.0 in | Wt 178.6 lb

## 2021-09-12 DIAGNOSIS — Z0000 Encounter for general adult medical examination without abnormal findings: Secondary | ICD-10-CM | POA: Diagnosis not present

## 2021-09-12 DIAGNOSIS — E663 Overweight: Secondary | ICD-10-CM | POA: Diagnosis not present

## 2021-09-12 DIAGNOSIS — F4323 Adjustment disorder with mixed anxiety and depressed mood: Secondary | ICD-10-CM

## 2021-09-12 NOTE — Progress Notes (Signed)
? ?Subjective:  ?  ?Pamela Harper is a 36 y.o. female and is here for a comprehensive physical exam. ? ?HPI ? ?Health Maintenance Due  ?Topic Date Due  ? Hepatitis C Screening  Never done  ? COVID-19 Vaccine (3 - Booster for Pfizer series) 11/04/2019  ? PAP SMEAR-Modifier  10/13/2021  ? ?Pt is 6 months postpartum with her third son. States she had a cesarean section no complications. She is still breast feeding and is feeling great.  At this time she is working on starting to get back into an exercise routine due to wanting to get back to her pre-pregnancy weight. Pt is managing well at this time.  ? ?Acute Concerns: ?None ? ?Chronic Issues: ?Anxiety/Depression ?Pamela Harper is currently compliant with taking zoloft 50 mg daily with no adverse effects. Pt states she is tolerating this medication well and has found it has improved her mood. She expresses that she is interested in eventually stopping this medication, but would like to do this when her youngest is at least a year old. Denies SI/HI.  ? ?Health Maintenance: ?Immunizations -- Covid- Due;2021 ?Influenza-  UTD;2022 ?Tdap-UTD;2022 ?PAP -- UTD; 6 months postpartum ?Bone Density -- N/A ?Diet -- Eats all food groups ?Sleep habits -- No concerns ?Exercise -- As able; Trying to get back into daily exercise ?Current Weight -- Stable ?Weight History: ?Wt Readings from Last 10 Encounters:  ?03/20/21 200 lb (90.7 kg)  ?03/08/21 200 lb (90.7 kg)  ?11/24/20 193 lb 3.2 oz (87.6 kg)  ?04/26/20 164 lb (74.4 kg)  ?02/29/20 164 lb (74.4 kg)  ?01/25/20 165 lb (74.8 kg)  ?11/24/19 167 lb 9.6 oz (76 kg)  ?09/18/19 172 lb 9.6 oz (78.3 kg)  ?03/20/19 166 lb (75.3 kg)  ? ?There is no height or weight on file to calculate BMI. ?Mood -- Stable ? ?No LMP recorded. ?Period characteristics -- Normal ?Birth control -- None reported. ? ? ? reports current alcohol use.  ?Tobacco Use: Low Risk   ? Smoking Tobacco Use: Never  ? Smokeless Tobacco Use: Never  ? Passive Exposure: Not on  file  ? ? ? ? ?  02/29/2020  ? 10:44 AM  ?Depression screen PHQ 2/9  ?Decreased Interest 0  ?Down, Depressed, Hopeless 0  ?PHQ - 2 Score 0  ?Altered sleeping 1  ?Tired, decreased energy 2  ?Change in appetite 2  ?Feeling bad or failure about yourself  0  ?Trouble concentrating 2  ?Moving slowly or fidgety/restless 0  ?Suicidal thoughts 0  ?PHQ-9 Score 7  ?Difficult doing work/chores Not difficult at all  ? ? ? ?Other providers/specialists: ?Patient Care Team: ?[name], PA as PCP - General (Physician Assistant)  ? ?PMHx, SurgHx, SocialHx, Medications, and Allergies were reviewed in the Visit Navigator and updated as appropriate.  ? ?Past Medical History:  ?Diagnosis Date  ? Depression   ? Seasonal allergies   ? ? ? ?Past Surgical History:  ?Procedure Laterality Date  ? CESAREAN SECTION  2019  ? CESAREAN SECTION N/A 03/20/2021  ? Procedure: CESAREAN SECTION;  Surgeon: Edwinna Areola, DO;  Location: [facility];  Service: Obstetrics;  Laterality: N/A;  request RNFA or 2nd scrub tech  ? CHOLECYSTECTOMY  2008  ? PERINEAL LACERATION REPAIR    ? ? ? ?Family History  ?Problem Relation Age of Onset  ? Diabetes Mother   ?     prediabetes  ? Sleep apnea Mother   ? Bipolar disorder Mother   ? Anxiety disorder  Mother   ? Depression Mother   ? Asthma Mother   ? Macular degeneration Mother   ? Hypothyroidism Mother   ? Hypertension Father   ? Multiple sclerosis Father   ? Depression Father   ? Sleep apnea Father   ? Hyperthyroidism Father   ? Anxiety disorder Sister   ? Macular degeneration Maternal Grandfather   ? Diabetes Paternal Grandmother   ? Hyperlipidemia Paternal Grandmother   ? Hypertension Paternal Grandmother   ? Cancer Paternal Grandfather   ? ? ?Social History  ? ?Tobacco Use  ? Smoking status: Never  ? Smokeless tobacco: Never  ?Vaping Use  ? Vaping Use: Never used  ?Substance Use Topics  ? Alcohol use: Yes  ?  Comment: Socially  ? Drug use: Never  ? ? ?Review of Systems:  ? ?Review of Systems   ?Constitutional:  Negative for chills, fever, malaise/fatigue and weight loss.  ?HENT:  Negative for hearing loss, sinus pain and sore throat.   ?Respiratory:  Negative for cough and hemoptysis.   ?Cardiovascular:  Negative for chest pain, palpitations, leg swelling and PND.  ?Gastrointestinal:  Negative for abdominal pain, constipation, diarrhea, heartburn, nausea and vomiting.  ?Genitourinary:  Negative for dysuria, frequency and urgency.  ?Musculoskeletal:  Negative for back pain, myalgias and neck pain.  ?Skin:  Negative for itching and rash.  ?Neurological:  Negative for dizziness, tingling, seizures and headaches.  ?Endo/Heme/Allergies:  Negative for polydipsia.  ?Psychiatric/Behavioral:  Negative for depression. The patient is not nervous/anxious.   ? ?Objective:  ? ?There were no vitals taken for this visit. ? ?General Appearance:    Alert, cooperative, no distress, appears stated age  ?Head:    Normocephalic, without obvious abnormality, atraumatic  ?Eyes:    PERRL, conjunctiva/corneas clear, EOM's intact, fundi  ?  benign, both eyes  ?Ears:    Normal TM's and external ear canals, both ears  ?Nose:   Nares normal, septum midline, mucosa normal, no drainage    or sinus tenderness  ?Throat:   Lips, mucosa, and tongue normal; teeth and gums normal  ?Neck:   Supple, symmetrical, trachea midline, no adenopathy;  ?  thyroid:  no enlargement/tenderness/nodules; no carotid ?  bruit or JVD  ?Back:     Symmetric, no curvature, ROM normal, no CVA tenderness  ?Lungs:     Clear to auscultation bilaterally, respirations unlabored  ?Chest Wall:    No tenderness or deformity  ? Heart:    Regular rate and rhythm, S1 and S2 normal, no murmur, rub   or gallop  ?Breast Exam:    Deferred  ?Abdomen:     Soft, non-tender, bowel sounds active all four quadrants,  ?  no masses, no organomegaly  ?Genitalia:    Deferred  ?Rectal:    Deferred  ?Extremities:   Extremities normal, atraumatic, no cyanosis or edema  ?Pulses:   2+ and  symmetric all extremities  ?Skin:   Skin color, texture, turgor normal, no rashes or lesions  ?Lymph nodes:   Cervical, supraclavicular, and axillary nodes normal  ?Neurologic:   CNII-XII intact, normal strength, sensation and reflexes  ?  throughout  ? ? ?Assessment/Plan:  ? ?Routine physical examination ?Today patient counseled on age appropriate routine health concerns for screening and prevention, each reviewed and up to date or declined. Immunizations reviewed and up to date or declined. Labs not ordered per patient request. Risk factors for depression reviewed and negative. Hearing function and visual acuity are intact. ADLs screened and addressed  as needed. Functional ability and level of safety reviewed and appropriate. Education, counseling and referrals performed based on assessed risks today. Patient provided with a copy of personalized plan for preventive services. ? ?*Declined blood work today ? ?Adjustment reaction with anxiety and depression ?Controlled ?No red flags;Denies SI/HI ?Continue Zoloft 50 mg daily   ?I advised patient that if they develop any SI, to tell someone immediately and seek medical attention ?Follow up as needed ? ?Obesity ?Continue healthy diet and exercise as able ? ?Patient Counseling: ? ? ?    ? Nutrition: Stressed importance of moderation in sodium/caffeine intake, saturated fat and cholesterol, caloric balance, sufficient intake of fresh fruits, vegetables, fiber, calcium, iron, and 1 mg of folate supplement per day (for females capable of pregnancy).  ? ?    ?  ?Stressed the importance of regular exercise.   ? ?    ? Substance Abuse: Discussed cessation/primary prevention of tobacco, alcohol, or other drug use; driving or other dangerous activities under the influence; availability of treatment for abuse.   ? ?      Injury prevention: Discussed safety belts, safety helmets, smoke detector, smoking near bedding or upholstery.   ? ?    ? ? Sexuality: Discussed  sexually transmitted diseases, partner selection, use of condoms, avoidance of unintended pregnancy  and contraceptive alternatives.   ? ?    ? Dental health: Discussed importance of regular tooth brushing, flossing,

## 2021-12-13 DIAGNOSIS — H52223 Regular astigmatism, bilateral: Secondary | ICD-10-CM | POA: Diagnosis not present

## 2021-12-28 ENCOUNTER — Encounter: Payer: Self-pay | Admitting: Family

## 2021-12-28 ENCOUNTER — Ambulatory Visit: Payer: 59 | Admitting: Family

## 2021-12-28 VITALS — BP 105/71 | HR 87 | Temp 97.5°F | Ht 65.0 in | Wt 176.4 lb

## 2021-12-28 DIAGNOSIS — H6690 Otitis media, unspecified, unspecified ear: Secondary | ICD-10-CM | POA: Diagnosis not present

## 2021-12-28 MED ORDER — AMOXICILLIN 500 MG PO CAPS
1000.0000 mg | ORAL_CAPSULE | Freq: Three times a day (TID) | ORAL | 0 refills | Status: DC
Start: 2021-12-28 — End: 2022-12-05

## 2021-12-28 NOTE — Progress Notes (Signed)
Patient ID: Pamela Harper, female    DOB: Feb 13, 1986, 36 y.o.   MRN: [id number]  Chief Complaint  Patient presents with   Nasal Congestion    Pt c/o nasal congestion,right ear ache and cough(greenish yellow drainage). Has tried saline /spray and ibuprofen, pt states she's breast feeding. Covid tested negative on 7/23 and 7/25.     HPI: Sinusitis:  started last Saturday, with cough, nasal drainage and then started having right ear pain. Using saline nasal spray and Ibuprofen with mild relief, afraid to take any other OTC meds due to breastfeeding.   Assessment & Plan:  1. Acute otitis media, unspecified otitis media type Mild infection - sending AMOX, advised ok to wait 1-2 d before starting if her pain is better. Advised to take 2-3 Ibuprofen tid after eating for pain.  - amoxicillin (AMOXIL) 500 MG capsule; Take 2 capsules (1,000 mg total) by mouth 3 (three) times daily.  Dispense: 30 capsule; Refill: 0    Subjective:    Outpatient Medications Prior to Visit  Medication Sig Dispense Refill   ibuprofen (ADVIL) 600 MG tablet Take 1 tablet (600 mg total) by mouth every 6 (six) hours. 30 tablet 0   Prenatal Vit-Fe Fumarate-FA (PRENATAL MULTIVITAMIN) TABS tablet Take 1 tablet by mouth daily.     sertraline (ZOLOFT) 50 MG tablet Take 1 tablet (50 mg total) by mouth at bedtime. 90 tablet 0   Calcium Carbonate Antacid (TUMS ULTRA 1000 PO) Take 1,000-2,000 mg by mouth daily as needed (heartburn).     oxyCODONE (OXY IR/ROXICODONE) 5 MG immediate release tablet Take 1-2 tablets (5-10 mg total) by mouth every 6 (six) hours as needed for severe pain. 20 tablet 0   No facility-administered medications prior to visit.   Past Medical History:  Diagnosis Date   Depression    Seasonal allergies    Past Surgical History:  Procedure Laterality Date   CESAREAN SECTION  2019   CESAREAN SECTION N/A 03/20/2021   Procedure: CESAREAN SECTION;  Surgeon: Edwinna Areola, DO;  Location:  [facility];  Service: Obstetrics;  Laterality: N/A;  request RNFA or 2nd scrub tech   CHOLECYSTECTOMY  2008   PERINEAL LACERATION REPAIR     Allergies  Allergen Reactions   Codeine Hives      Objective:    Physical Exam Vitals and nursing note reviewed.  Constitutional:      Appearance: Normal appearance.  HENT:     Right Ear: Ear canal normal. A middle ear effusion is present. Tympanic membrane is erythematous. Tympanic membrane is not perforated, retracted or bulging.     Left Ear: Tympanic membrane and ear canal normal.  Cardiovascular:     Rate and Rhythm: Normal rate and regular rhythm.  Pulmonary:     Effort: Pulmonary effort is normal.     Breath sounds: Normal breath sounds.  Musculoskeletal:        General: Normal range of motion.  Skin:    General: Skin is warm and dry.  Neurological:     Mental Status: She is alert.  Psychiatric:        Mood and Affect: Mood normal.        Behavior: Behavior normal.    BP 105/71 (BP Location: Left Arm, Patient Position: Sitting, Cuff Size: Large)   Pulse 87   Temp (!) 97.5 F (36.4 C) (Temporal)   Ht 5\' 5"  (1.651 m)   Wt 176 lb 6 oz (80 kg)   LMP  (  LMP Unknown)   SpO2 98%   Breastfeeding No   BMI 29.35 kg/m  Wt Readings from Last 3 Encounters:  12/28/21 176 lb 6 oz (80 kg)  09/12/21 178 lb 9.6 oz (81 kg)  03/20/21 200 lb (90.7 kg)       Dulce Sellar, NP

## 2022-01-03 ENCOUNTER — Other Ambulatory Visit: Payer: Self-pay | Admitting: Physician Assistant

## 2022-01-03 ENCOUNTER — Other Ambulatory Visit (HOSPITAL_COMMUNITY): Payer: Self-pay

## 2022-01-03 MED ORDER — SERTRALINE HCL 50 MG PO TABS
50.0000 mg | ORAL_TABLET | Freq: Every day | ORAL | 0 refills | Status: DC
  Filled 2022-01-03: qty 90, 90d supply, fill #0

## 2022-01-12 IMAGING — DX DG RIBS W/ CHEST 3+V*L*
3 series · 3 of 3 positions shown · non-contrast
Comparison: None.

CLINICAL DATA: Rib contusion with worsening pain. History of injury
to left anterior ribs

EXAM:
LEFT RIBS AND CHEST - 3+ VIEW

[chest pa]
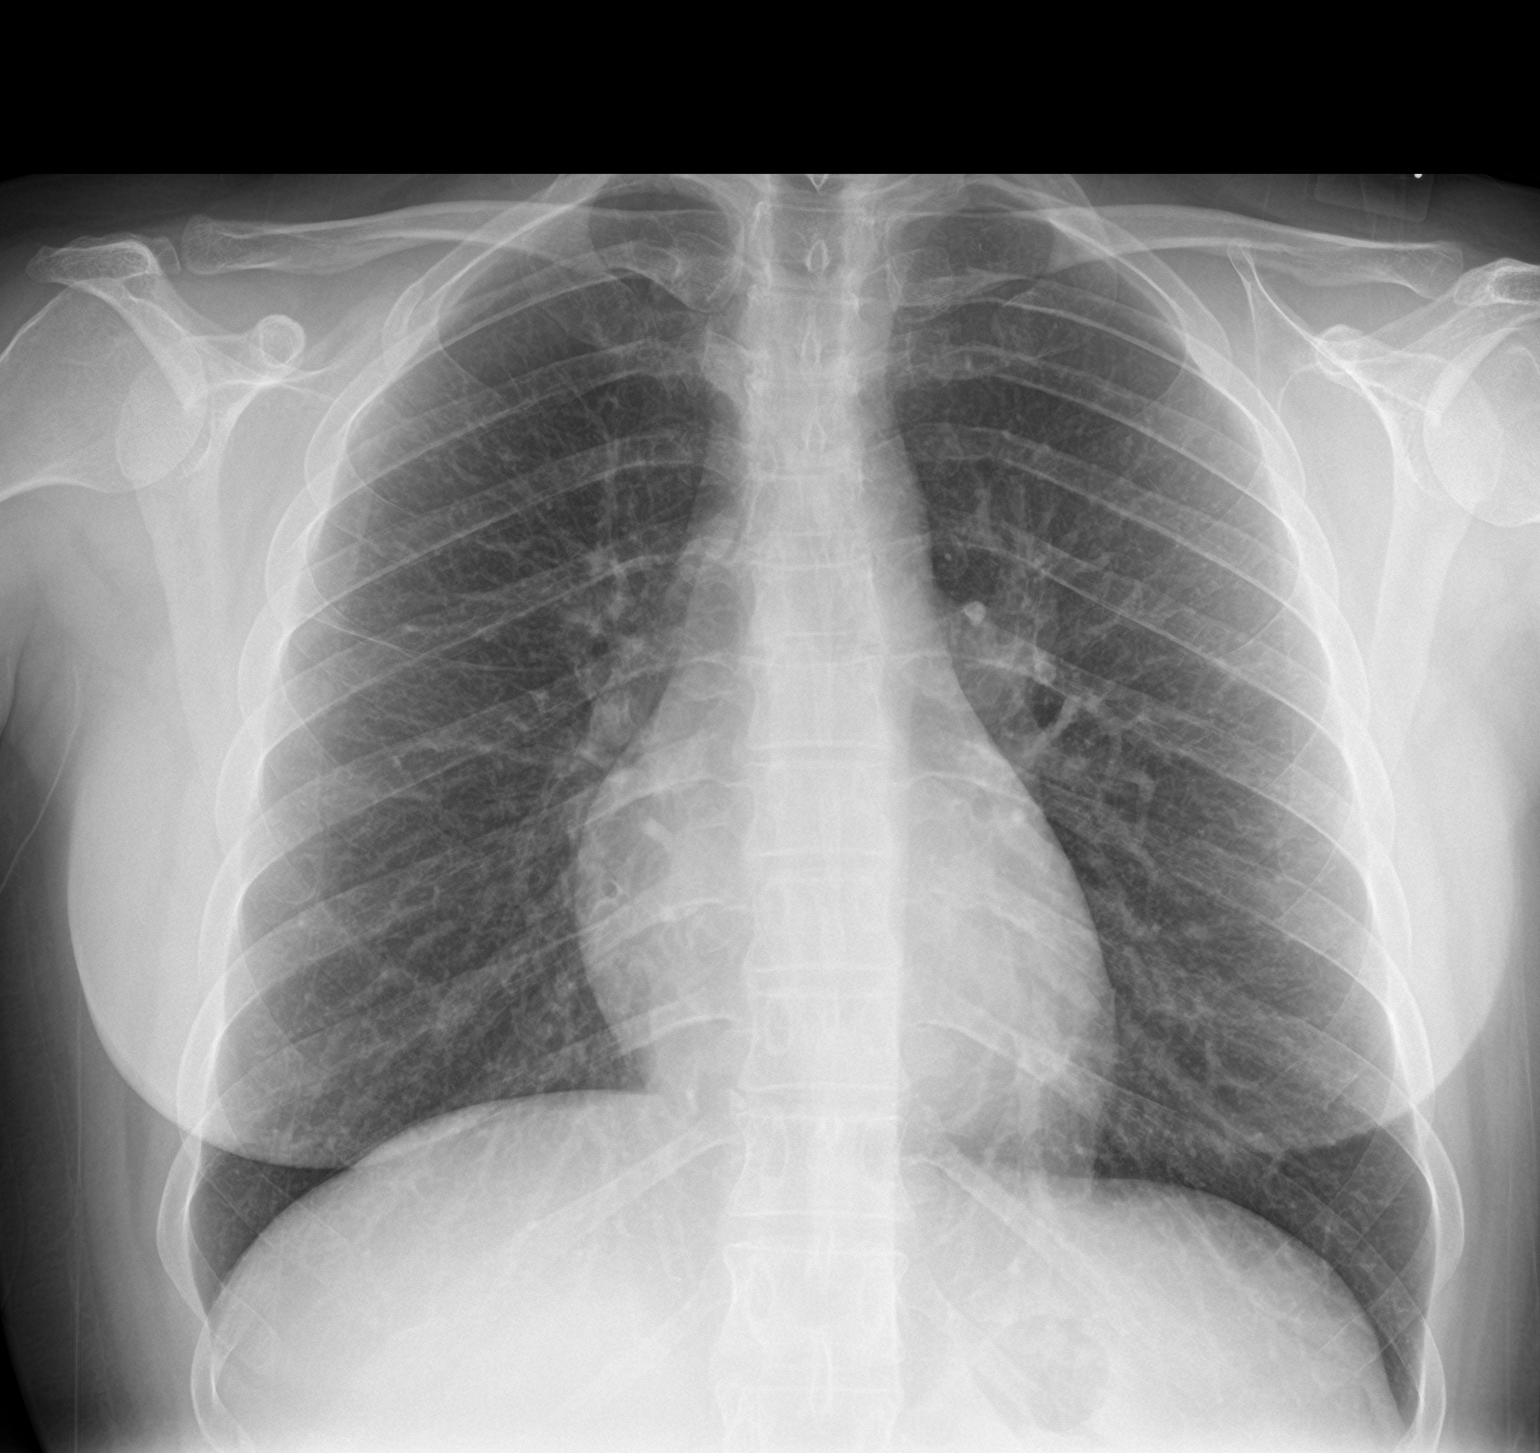

[rib pa]
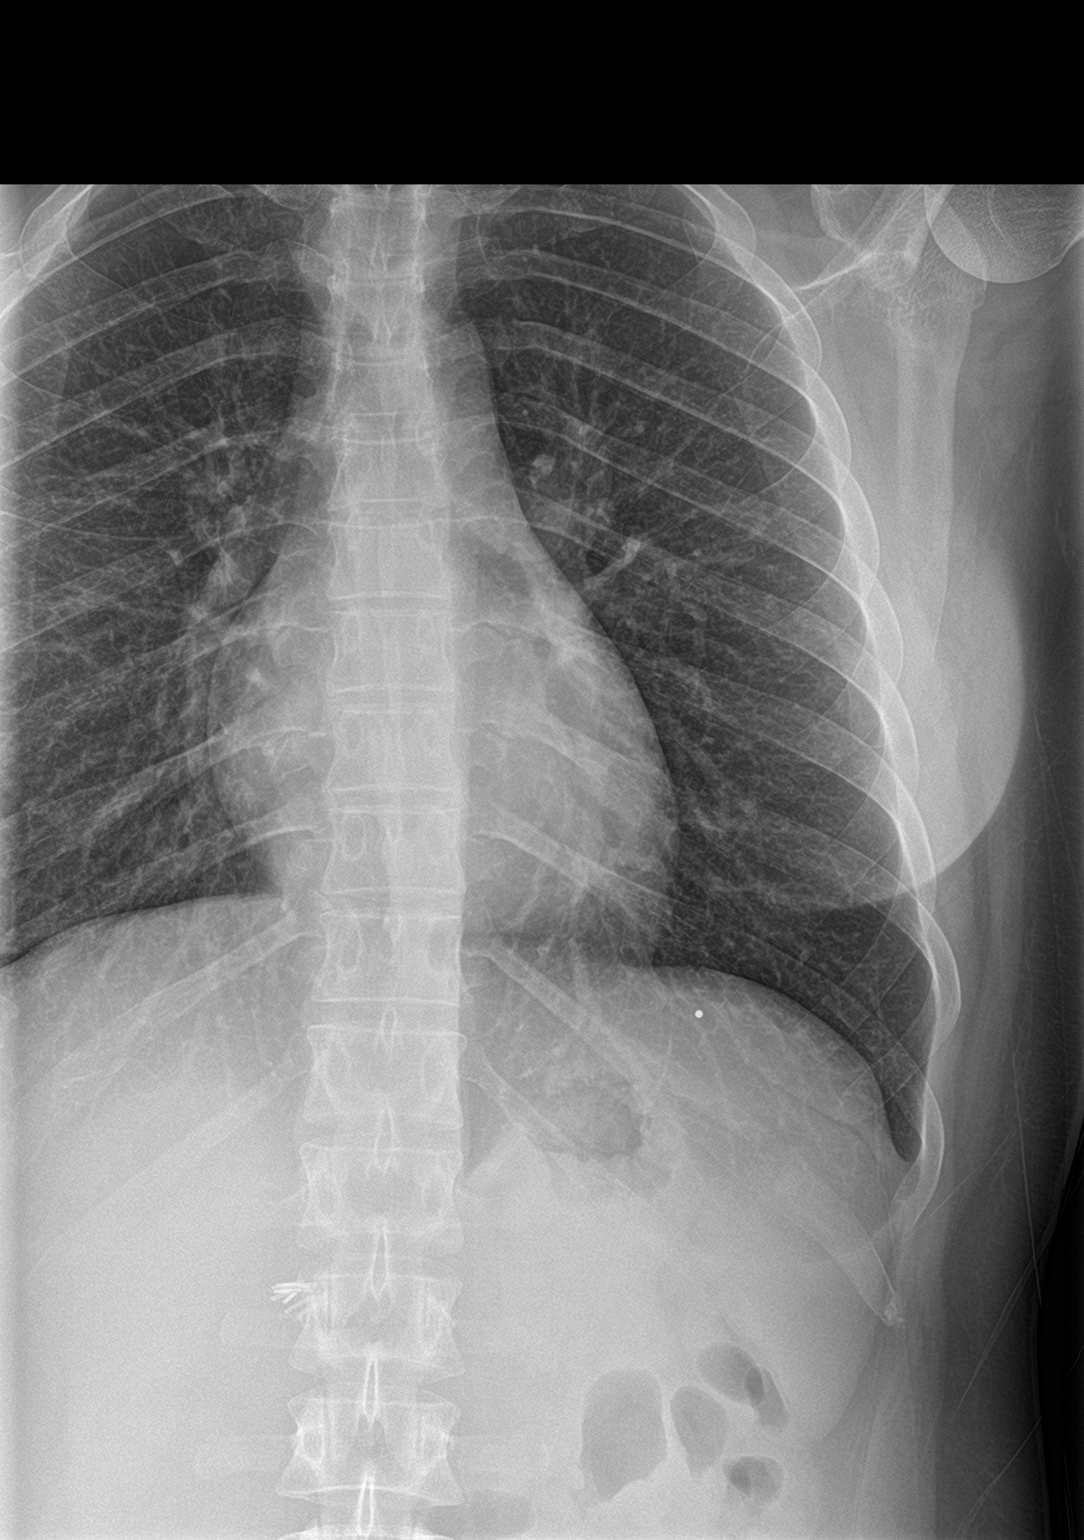

[rib obl]
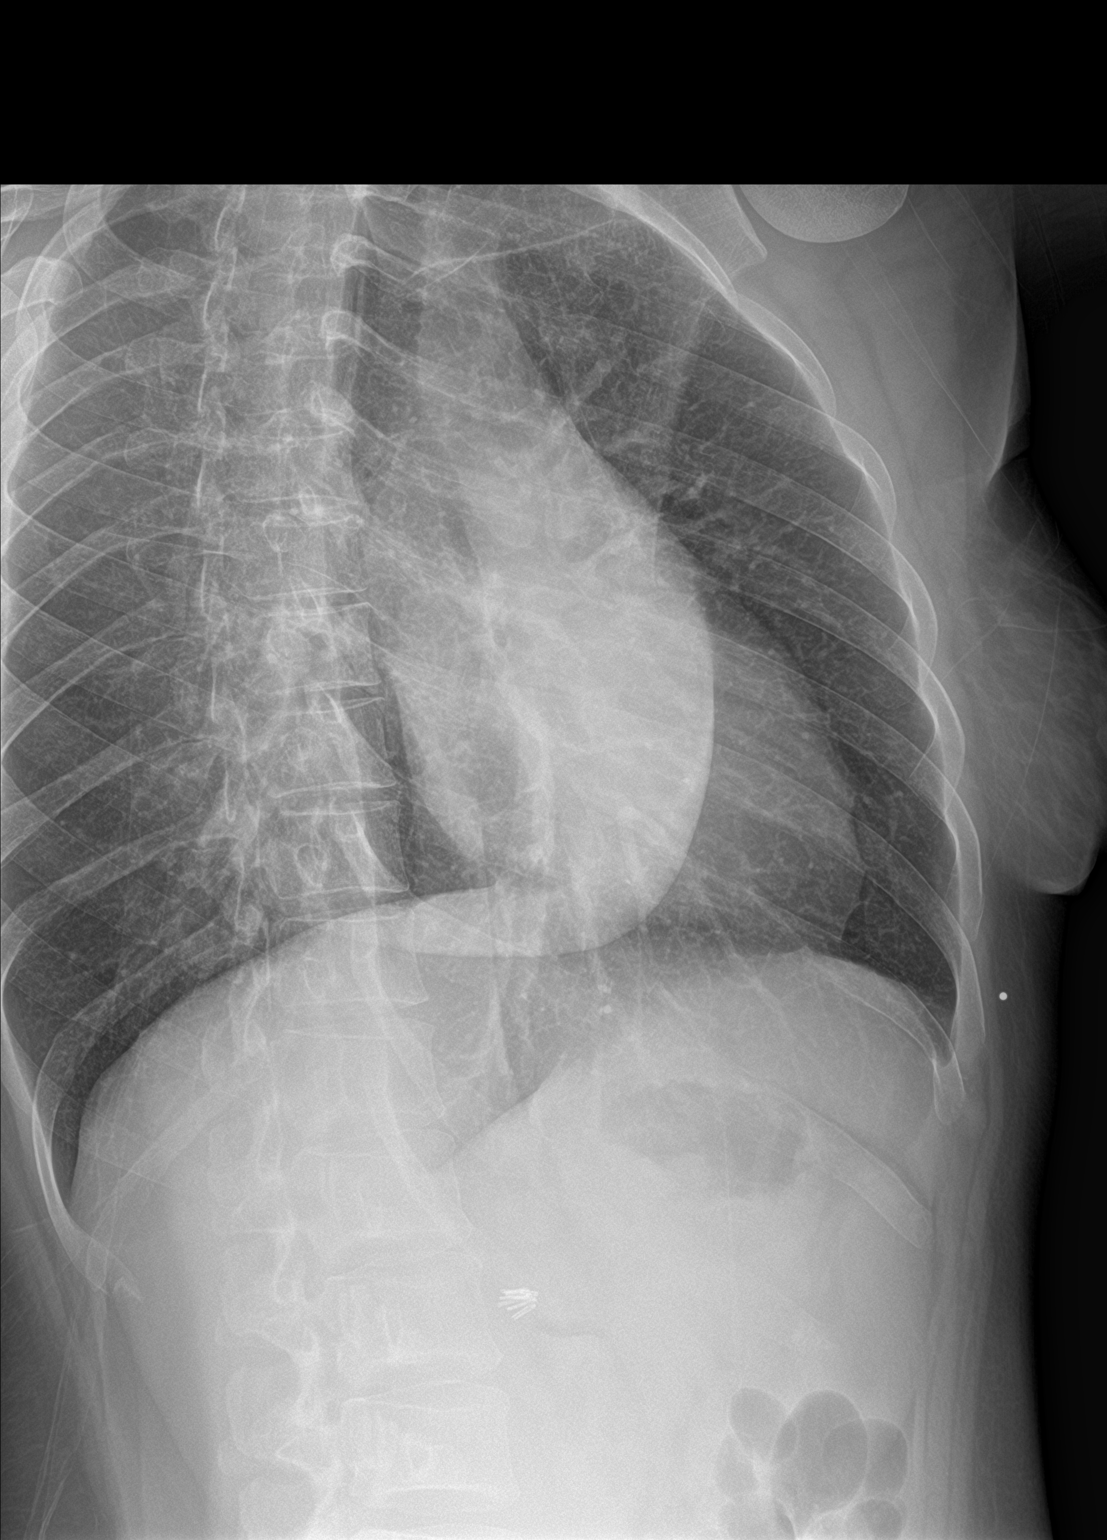

[3 of 3 positions shown; findings below may reference images not displayed]

FINDINGS: No fracture or other bone lesions are seen involving the ribs. There
is no evidence of pneumothorax or pleural effusion. Both lungs are
clear. Heart size and mediastinal contours are within normal limits.
IMPRESSION: Negative.

## 2022-02-26 ENCOUNTER — Encounter: Payer: Self-pay | Admitting: *Deleted

## 2022-05-14 DIAGNOSIS — Z130 Encounter for screening for diseases of the blood and blood-forming organs and certain disorders involving the immune mechanism: Secondary | ICD-10-CM | POA: Diagnosis not present

## 2022-05-14 DIAGNOSIS — Z3009 Encounter for other general counseling and advice on contraception: Secondary | ICD-10-CM | POA: Diagnosis not present

## 2022-05-14 DIAGNOSIS — Z1389 Encounter for screening for other disorder: Secondary | ICD-10-CM | POA: Diagnosis not present

## 2022-05-14 DIAGNOSIS — Z01419 Encounter for gynecological examination (general) (routine) without abnormal findings: Secondary | ICD-10-CM | POA: Diagnosis not present

## 2022-05-17 ENCOUNTER — Encounter: Payer: Self-pay | Admitting: *Deleted

## 2022-11-27 ENCOUNTER — Encounter: Payer: Self-pay | Admitting: Physician Assistant

## 2022-11-28 NOTE — Progress Notes (Signed)
Subjective:    Pamela Harper is a 37 y.o. female and is here for a comprehensive physical exam.  HPI  Health Maintenance Due  Topic Date Due   PAP SMEAR-Modifier  10/13/2021   Acute Concerns: None  Chronic Issues: None  Does not have any skin issues and no family history of melanoma. Denies unusual headaches, numbness, tingling, weakness, swelling in legs/ankles, GI symptoms. Takes prenatal multivitamin.  Health Maintenance: Immunizations -- UTD  Colonoscopy -- N/A Mammogram -- N/A PAP -- Last completed 10/14/18. Bone Density -- N/A Diet -- Eats healthy. Exercise -- Walks for exercise. More active when kids are in school.  Sleep habits -- Stable. Loses sleep some nights due to children. Mood -- Stable  UTD with dentist? - UTD UTD with eye doctor? - UTD  Weight history: Wt Readings from Last 10 Encounters:  12/05/22 177 lb 6.1 oz (80.5 kg)  12/28/21 176 lb 6 oz (80 kg)  09/12/21 178 lb 9.6 oz (81 kg)  03/20/21 200 lb (90.7 kg)  03/08/21 200 lb (90.7 kg)  11/24/20 193 lb 3.2 oz (87.6 kg)  04/26/20 164 lb (74.4 kg)  02/29/20 164 lb (74.4 kg)  01/25/20 165 lb (74.8 kg)  11/24/19 167 lb 9.6 oz (76 kg)   Body mass index is 29.52 kg/m. Patient's last menstrual period was 11/26/2022 (exact date).  Alcohol use:  reports current alcohol use.  Tobacco use:  Tobacco Use: Low Risk  (12/05/2022)   Patient History    Smoking Tobacco Use: Never    Smokeless Tobacco Use: Never    Passive Exposure: Not on file   Eligible for lung cancer screening? No     12/05/2022    9:45 AM  Depression screen PHQ 2/9  Decreased Interest 0  Down, Depressed, Hopeless 0  PHQ - 2 Score 0     Other providers/specialists: Patient Care Team: [name], [place] as PCP - General (Physician Assistant)    PMHx, SurgHx, SocialHx, Medications, and Allergies were reviewed in the Visit Navigator and updated as appropriate.   Past Medical History:  Diagnosis Date   Depression     Seasonal allergies      Past Surgical History:  Procedure Laterality Date   CESAREAN SECTION  2019   CESAREAN SECTION N/A 03/20/2021   Procedure: CESAREAN SECTION;  Surgeon: Edwinna Areola, DO;  Location: [facility];  Service: Obstetrics;  Laterality: N/A;  request RNFA or 2nd scrub tech   CHOLECYSTECTOMY  2008   PERINEAL LACERATION REPAIR       Family History  Problem Relation Age of Onset   Diabetes Mother    Sleep apnea Mother    Bipolar disorder Mother    Anxiety disorder Mother    Depression Mother    Asthma Mother    Macular degeneration Mother    Hypothyroidism Mother    Hypertension Father    Multiple sclerosis Father    Depression Father    Sleep apnea Father    Hyperthyroidism Father    Anxiety disorder Sister    Heart failure Sister    Pancreatitis Maternal Grandmother    Atrial fibrillation Maternal Grandmother    Macular degeneration Maternal Grandfather    Diabetes Paternal Grandmother    Hyperlipidemia Paternal Grandmother    Hypertension Paternal Grandmother    Pancreatic cancer Paternal Grandfather     Social History   Tobacco Use   Smoking status: Never   Smokeless tobacco: Never  Vaping Use   Vaping Use:  Never used  Substance Use Topics   Alcohol use: Yes    Comment: Socially   Drug use: Never    Review of Systems:   Review of Systems  Constitutional:  Negative for chills, fever, malaise/fatigue and weight loss.  HENT:  Negative for hearing loss, sinus pain and sore throat.   Respiratory:  Negative for cough, hemoptysis and shortness of breath.   Cardiovascular:  Negative for chest pain, palpitations, leg swelling and PND.  Gastrointestinal:  Negative for abdominal pain, constipation, diarrhea, heartburn, nausea and vomiting.  Genitourinary:  Negative for dysuria, frequency and urgency.  Musculoskeletal:  Negative for back pain, myalgias and neck pain.  Skin:  Negative for itching and rash.  Neurological:  Negative for  dizziness, tingling, seizures and headaches.  Endo/Heme/Allergies:  Negative for polydipsia.  Psychiatric/Behavioral:  Negative for depression. The patient is not nervous/anxious.     Objective:   BP 100/68 (BP Location: Left Arm, Patient Position: Sitting, Cuff Size: Large)   Pulse 77   Temp 97.7 F (36.5 C) (Temporal)   Ht 5\' 5"  (1.651 m)   Wt 177 lb 6.1 oz (80.5 kg)   LMP 11/26/2022 (Exact Date)   SpO2 99%   Breastfeeding No   BMI 29.52 kg/m  Body mass index is 29.52 kg/m.   General Appearance:    Alert, cooperative, no distress, appears stated age  Head:    Normocephalic, without obvious abnormality, atraumatic  Eyes:    PERRL, conjunctiva/corneas clear, EOM's intact, fundi    benign, both eyes  Ears:    Normal TM's and external ear canals, both ears  Nose:   Nares normal, septum midline, mucosa normal, no drainage    or sinus tenderness  Throat:   Lips, mucosa, and tongue normal; teeth and gums normal  Neck:   Supple, symmetrical, trachea midline, no adenopathy;    thyroid:  no enlargement/tenderness/nodules; no carotid   bruit or JVD  Back:     Symmetric, no curvature, ROM normal, no CVA tenderness  Lungs:     Clear to auscultation bilaterally, respirations unlabored  Chest Wall:    No tenderness or deformity   Heart:    Regular rate and rhythm, S1 and S2 normal, no murmur, rub or gallop  Breast Exam:    Deferred   Abdomen:     Soft, non-tender, bowel sounds active all four quadrants,    no masses, no organomegaly  Genitalia:    Deferred  Extremities:   Extremities normal, atraumatic, no cyanosis or edema  Pulses:   2+ and symmetric all extremities  Skin:   Skin color, texture, turgor normal, no rashes or lesions  Lymph nodes:   Cervical, supraclavicular, and axillary nodes normal  Neurologic:   CNII-XII intact, normal strength, sensation and reflexes    throughout    Assessment/Plan:   Routine physical examination Today patient counseled on age appropriate  routine health concerns for screening and prevention, each reviewed and up to date or declined. Immunizations reviewed and up to date or declined. Labs ordered and reviewed. Risk factors for depression reviewed and negative. Hearing function and visual acuity are intact. ADLs screened and addressed as needed. Functional ability and level of safety reviewed and appropriate. Education, counseling and referrals performed based on assessed risks today. Patient provided with a copy of personalized plan for preventive services.  Overweight Continue healthy lifestyle efforts  Encounter for lipid screening for cardiovascular disease Update lipid panel and make recommendations accordingly  I,Alexander Ruley,acting as a scribe  for [name], PA.,have documented all relevant documentation on the behalf of [name], PA,as directed by  [name], PA while in the presence of [name], [place].  I, [name], [place], have reviewed all documentation for this visit. The documentation on 12/05/22 for the exam, diagnosis, procedures, and orders are all accurate and complete.'  [name], PA-C [facility]

## 2022-12-05 ENCOUNTER — Encounter: Payer: Self-pay | Admitting: Physician Assistant

## 2022-12-05 ENCOUNTER — Ambulatory Visit: Payer: 59 | Admitting: Physician Assistant

## 2022-12-05 VITALS — BP 100/68 | HR 77 | Temp 97.7°F | Ht 65.0 in | Wt 177.4 lb

## 2022-12-05 DIAGNOSIS — E663 Overweight: Secondary | ICD-10-CM

## 2022-12-05 DIAGNOSIS — Z13220 Encounter for screening for lipoid disorders: Secondary | ICD-10-CM

## 2022-12-05 DIAGNOSIS — Z0000 Encounter for general adult medical examination without abnormal findings: Secondary | ICD-10-CM

## 2022-12-05 DIAGNOSIS — Z136 Encounter for screening for cardiovascular disorders: Secondary | ICD-10-CM

## 2022-12-05 LAB — COMPREHENSIVE METABOLIC PANEL WITH GFR
ALT: 29 U/L (ref 0–35)
AST: 20 U/L (ref 0–37)
Albumin: 4.8 g/dL (ref 3.5–5.2)
Alkaline Phosphatase: 70 U/L (ref 39–117)
BUN: 16 mg/dL (ref 6–23)
CO2: 28 meq/L (ref 19–32)
Calcium: 10.1 mg/dL (ref 8.4–10.5)
Chloride: 102 meq/L (ref 96–112)
Creatinine, Ser: 0.86 mg/dL (ref 0.40–1.20)
GFR: 85.76 mL/min
Glucose, Bld: 110 mg/dL — ABNORMAL HIGH (ref 70–99)
Potassium: 5.4 meq/L — ABNORMAL HIGH (ref 3.5–5.1)
Sodium: 138 meq/L (ref 135–145)
Total Bilirubin: 0.7 mg/dL (ref 0.2–1.2)
Total Protein: 8.2 g/dL (ref 6.0–8.3)

## 2022-12-05 LAB — CBC WITH DIFFERENTIAL/PLATELET
Basophils Absolute: 0.1 K/uL (ref 0.0–0.1)
Basophils Relative: 0.8 % (ref 0.0–3.0)
Eosinophils Absolute: 0.2 K/uL (ref 0.0–0.7)
Eosinophils Relative: 2.5 % (ref 0.0–5.0)
HCT: 44.1 % (ref 36.0–46.0)
Hemoglobin: 14.2 g/dL (ref 12.0–15.0)
Lymphocytes Relative: 36.5 % (ref 12.0–46.0)
Lymphs Abs: 3 K/uL (ref 0.7–4.0)
MCHC: 32.1 g/dL (ref 30.0–36.0)
MCV: 80.4 fl (ref 78.0–100.0)
Monocytes Absolute: 0.4 K/uL (ref 0.1–1.0)
Monocytes Relative: 4.8 % (ref 3.0–12.0)
Neutro Abs: 4.6 K/uL (ref 1.4–7.7)
Neutrophils Relative %: 55.4 % (ref 43.0–77.0)
Platelets: 305 K/uL (ref 150.0–400.0)
RBC: 5.48 Mil/uL — ABNORMAL HIGH (ref 3.87–5.11)
RDW: 14.1 % (ref 11.5–15.5)
WBC: 8.3 K/uL (ref 4.0–10.5)

## 2022-12-05 LAB — LIPID PANEL
Cholesterol: 169 mg/dL (ref 0–200)
HDL: 57.9 mg/dL
LDL Cholesterol: 93 mg/dL (ref 0–99)
NonHDL: 110.66
Total CHOL/HDL Ratio: 3
Triglycerides: 89 mg/dL (ref 0.0–149.0)
VLDL: 17.8 mg/dL (ref 0.0–40.0)

## 2022-12-05 NOTE — Patient Instructions (Signed)
It was great to see you! ? ?Please go to the lab for blood work.  ? ?Our office will call you with your results unless you have chosen to receive results via MyChart. ? ?If your blood work is normal we will follow-up each year for physicals and as scheduled for chronic medical problems. ? ?If anything is abnormal we will treat accordingly and get you in for a follow-up. ? ?Take care, ? ?Pamela Harper ?  ? ? ?

## 2022-12-07 ENCOUNTER — Other Ambulatory Visit: Payer: Self-pay | Admitting: Physician Assistant

## 2022-12-07 DIAGNOSIS — R7309 Other abnormal glucose: Secondary | ICD-10-CM

## 2022-12-07 DIAGNOSIS — Z8249 Family history of ischemic heart disease and other diseases of the circulatory system: Secondary | ICD-10-CM

## 2022-12-07 DIAGNOSIS — E875 Hyperkalemia: Secondary | ICD-10-CM

## 2022-12-07 NOTE — Telephone Encounter (Signed)
Please advise 

## 2022-12-11 ENCOUNTER — Telehealth (HOSPITAL_BASED_OUTPATIENT_CLINIC_OR_DEPARTMENT_OTHER): Payer: Self-pay | Admitting: *Deleted

## 2022-12-11 NOTE — Telephone Encounter (Signed)
Left message for patient to call and schedule the Echocardiogram  ordered by [name], PA

## 2022-12-14 NOTE — Telephone Encounter (Signed)
Left message for patient to call and discuss scheduling the Echocardiogram ordered by Samantha Worley, PA 

## 2022-12-19 ENCOUNTER — Encounter (HOSPITAL_BASED_OUTPATIENT_CLINIC_OR_DEPARTMENT_OTHER): Payer: Self-pay

## 2022-12-19 ENCOUNTER — Encounter (HOSPITAL_BASED_OUTPATIENT_CLINIC_OR_DEPARTMENT_OTHER): Payer: Self-pay | Admitting: *Deleted

## 2022-12-19 NOTE — Telephone Encounter (Signed)
Left message for patient to call and discuss scheduling the Echocardiogram ordered by [name], PA--Will also send a My Chart message and mail letter requesting patient call

## 2022-12-26 ENCOUNTER — Other Ambulatory Visit (INDEPENDENT_AMBULATORY_CARE_PROVIDER_SITE_OTHER): Payer: 59

## 2022-12-26 DIAGNOSIS — R7309 Other abnormal glucose: Secondary | ICD-10-CM | POA: Diagnosis not present

## 2022-12-26 DIAGNOSIS — E875 Hyperkalemia: Secondary | ICD-10-CM | POA: Diagnosis not present

## 2022-12-26 LAB — BASIC METABOLIC PANEL
BUN: 19 mg/dL (ref 6–23)
CO2: 26 mEq/L (ref 19–32)
Calcium: 10 mg/dL (ref 8.4–10.5)
Chloride: 101 mEq/L (ref 96–112)
Creatinine, Ser: 0.82 mg/dL (ref 0.40–1.20)
GFR: 90.77 mL/min (ref >60.00)
Glucose, Bld: 93 mg/dL (ref 70–99)
Potassium: 4.3 mEq/L (ref 3.5–5.1)
Sodium: 135 mEq/L (ref 135–145)

## 2022-12-26 LAB — HEMOGLOBIN A1C: Hgb A1c MFr Bld: 5.8 % (ref 4.6–6.5)

## 2022-12-27 NOTE — Telephone Encounter (Signed)
Okay to order A1c recheck in 6 months?

## 2023-01-18 ENCOUNTER — Ambulatory Visit (INDEPENDENT_AMBULATORY_CARE_PROVIDER_SITE_OTHER): Payer: 59

## 2023-01-18 DIAGNOSIS — Z8249 Family history of ischemic heart disease and other diseases of the circulatory system: Secondary | ICD-10-CM

## 2023-01-18 LAB — ECHOCARDIOGRAM COMPLETE
AR max vel: 2.35 cm2
AV Area VTI: 2.27 cm2
AV Area mean vel: 2.14 cm2
AV Mean grad: 2 mmHg
AV Peak grad: 4.9 mmHg
Ao pk vel: 1.11 m/s
Area-P 1/2: 4.01 cm2
Calc EF: 54.6 %
S' Lateral: 3.65 cm
Single Plane A2C EF: 51.9 %
Single Plane A4C EF: 56.6 %

## 2023-03-25 ENCOUNTER — Other Ambulatory Visit (HOSPITAL_COMMUNITY): Payer: Self-pay

## 2023-03-25 ENCOUNTER — Encounter: Payer: Self-pay | Admitting: Physician Assistant

## 2023-03-25 ENCOUNTER — Other Ambulatory Visit: Payer: Self-pay

## 2023-03-25 ENCOUNTER — Telehealth: Payer: 59 | Admitting: Physician Assistant

## 2023-03-25 VITALS — Ht 65.0 in | Wt 169.0 lb

## 2023-03-25 DIAGNOSIS — F4323 Adjustment disorder with mixed anxiety and depressed mood: Secondary | ICD-10-CM

## 2023-03-25 MED ORDER — SERTRALINE HCL 50 MG PO TABS
ORAL_TABLET | ORAL | 1 refills | Status: DC
  Filled 2023-03-25 – 2023-07-17 (×2): qty 90, 90d supply, fill #0

## 2023-03-25 NOTE — Progress Notes (Signed)
   I acted as a Neurosurgeon for [name], PA-C [name], LPN  Virtual Visit via Video Note   I, [name], PA, connected with  Pamela Harper  ([id number], [birth year]) on 03/25/23 at 11:00 AM EDT by a video-enabled telemedicine application and verified that I am speaking with the correct person using two identifiers.  Location: Patient: Home Provider: [facility] [place] [place] office   I discussed the limitations of evaluation and management by telemedicine and the availability of in person appointments. The patient expressed understanding and agreed to proceed.    History of Present Illness: [name] is a 37 y.o. who identifies as a female who was assigned female at birth, and is being seen today for anxiety/depression.   Pt c/o having issues since August, feels a steady state of anxiety even though she is doing things to help with her anxiety, also not able to function to the level she can. Pt would like to discuss medication. She is meeting up with a therapist starting this week. Denies SI/HI Overall able to do all of her responsibilities but having some episodes of anxiety that can be paralyzing for her She is also admittedly anxious about her slight elevation in A1c (5.8) and has been actively working on healthy lifestyle  Problems: There are no problems to display for this patient.   Allergies:  Allergies  Allergen Reactions   Codeine Hives   Medications:  Current Outpatient Medications:    sertraline (ZOLOFT) 50 MG tablet, Take 1/2 tablet for 2-4 weeks, then increase to full tablet daily., Disp: 90 tablet, Rfl: 1  Observations/Objective: Patient is well-developed, well-nourished in no acute distress.  Resting comfortably  at home.  Head is normocephalic, atraumatic.  No labored breathing.  Speech is clear and coherent with logical content.  Patient is alert and oriented at baseline.   Assessment and Plan: 1. Adjustment reaction with  anxiety and depression Uncontrolled Denies SI/HI Start Zoloft 25 mg daily and then after 2-4 weeks, increase to 50 mg (this is the dose she was on prior) Continue to monitor symptom(s)  Follow-up in 3-6 month(s), sooner if concerns I discussed with patient that if they develop any SI, to tell someone immediately and seek medical attention.  Follow Up Instructions: I discussed the assessment and treatment plan with the patient. The patient was provided an opportunity to ask questions and all were answered. The patient agreed with the plan and demonstrated an understanding of the instructions.  A copy of instructions were sent to the patient via MyChart unless otherwise noted below.   The patient was advised to call back or seek an in-person evaluation if the symptoms worsen or if the condition fails to improve as anticipated.  [name], [place]

## 2023-03-28 DIAGNOSIS — F411 Generalized anxiety disorder: Secondary | ICD-10-CM | POA: Diagnosis not present

## 2023-04-04 DIAGNOSIS — F411 Generalized anxiety disorder: Secondary | ICD-10-CM | POA: Diagnosis not present

## 2023-04-11 DIAGNOSIS — F411 Generalized anxiety disorder: Secondary | ICD-10-CM | POA: Diagnosis not present

## 2023-04-17 DIAGNOSIS — F411 Generalized anxiety disorder: Secondary | ICD-10-CM | POA: Diagnosis not present

## 2023-04-23 DIAGNOSIS — F411 Generalized anxiety disorder: Secondary | ICD-10-CM | POA: Diagnosis not present

## 2023-05-09 DIAGNOSIS — F411 Generalized anxiety disorder: Secondary | ICD-10-CM | POA: Diagnosis not present

## 2023-05-20 ENCOUNTER — Other Ambulatory Visit (INDEPENDENT_AMBULATORY_CARE_PROVIDER_SITE_OTHER): Payer: 59

## 2023-05-20 DIAGNOSIS — E663 Overweight: Secondary | ICD-10-CM

## 2023-05-20 LAB — HEMOGLOBIN A1C: Hgb A1c MFr Bld: 5.7 % (ref 4.6–6.5)

## 2023-05-21 ENCOUNTER — Encounter: Payer: Self-pay | Admitting: Physician Assistant

## 2023-05-22 ENCOUNTER — Other Ambulatory Visit: Payer: Self-pay

## 2023-05-22 DIAGNOSIS — F411 Generalized anxiety disorder: Secondary | ICD-10-CM | POA: Diagnosis not present

## 2023-05-22 MED ORDER — FREESTYLE LITE TEST VI STRP
ORAL_STRIP | 4 refills | Status: DC
  Filled 2023-05-22: qty 100, 25d supply, fill #0

## 2023-05-22 MED ORDER — FREESTYLE LANCETS MISC
4 refills | Status: AC
  Filled 2023-05-22: qty 100, 25d supply, fill #0

## 2023-07-17 ENCOUNTER — Other Ambulatory Visit: Payer: Self-pay

## 2023-07-17 ENCOUNTER — Other Ambulatory Visit (HOSPITAL_BASED_OUTPATIENT_CLINIC_OR_DEPARTMENT_OTHER): Payer: Self-pay

## 2023-07-31 DIAGNOSIS — H5203 Hypermetropia, bilateral: Secondary | ICD-10-CM | POA: Diagnosis not present

## 2023-08-05 DIAGNOSIS — R39198 Other difficulties with micturition: Secondary | ICD-10-CM | POA: Diagnosis not present

## 2023-08-05 DIAGNOSIS — F411 Generalized anxiety disorder: Secondary | ICD-10-CM | POA: Diagnosis not present

## 2023-09-11 DIAGNOSIS — Z124 Encounter for screening for malignant neoplasm of cervix: Secondary | ICD-10-CM | POA: Diagnosis not present

## 2023-09-11 DIAGNOSIS — Z1389 Encounter for screening for other disorder: Secondary | ICD-10-CM | POA: Diagnosis not present

## 2023-09-11 DIAGNOSIS — Z113 Encounter for screening for infections with a predominantly sexual mode of transmission: Secondary | ICD-10-CM | POA: Diagnosis not present

## 2023-09-11 DIAGNOSIS — Z01419 Encounter for gynecological examination (general) (routine) without abnormal findings: Secondary | ICD-10-CM | POA: Diagnosis not present

## 2023-09-11 DIAGNOSIS — R7303 Prediabetes: Secondary | ICD-10-CM | POA: Diagnosis not present

## 2023-09-11 DIAGNOSIS — N940 Mittelschmerz: Secondary | ICD-10-CM | POA: Diagnosis not present

## 2023-09-11 DIAGNOSIS — Z130 Encounter for screening for diseases of the blood and blood-forming organs and certain disorders involving the immune mechanism: Secondary | ICD-10-CM | POA: Diagnosis not present

## 2023-09-12 DIAGNOSIS — R7303 Prediabetes: Secondary | ICD-10-CM | POA: Diagnosis not present

## 2023-09-13 LAB — HM PAP SMEAR: HPV, high-risk: NEGATIVE

## 2023-10-22 DIAGNOSIS — R238 Other skin changes: Secondary | ICD-10-CM | POA: Diagnosis not present

## 2023-10-22 DIAGNOSIS — L538 Other specified erythematous conditions: Secondary | ICD-10-CM | POA: Diagnosis not present

## 2023-10-22 DIAGNOSIS — L700 Acne vulgaris: Secondary | ICD-10-CM | POA: Diagnosis not present

## 2023-10-22 DIAGNOSIS — B081 Molluscum contagiosum: Secondary | ICD-10-CM | POA: Diagnosis not present

## 2023-10-22 DIAGNOSIS — L2989 Other pruritus: Secondary | ICD-10-CM | POA: Diagnosis not present

## 2023-12-01 ENCOUNTER — Other Ambulatory Visit: Payer: Self-pay | Admitting: Physician Assistant

## 2023-12-02 ENCOUNTER — Other Ambulatory Visit: Payer: Self-pay

## 2023-12-02 ENCOUNTER — Other Ambulatory Visit (HOSPITAL_BASED_OUTPATIENT_CLINIC_OR_DEPARTMENT_OTHER): Payer: Self-pay

## 2023-12-02 ENCOUNTER — Other Ambulatory Visit (HOSPITAL_COMMUNITY): Payer: Self-pay

## 2023-12-02 MED ORDER — SERTRALINE HCL 50 MG PO TABS
ORAL_TABLET | ORAL | 1 refills | Status: DC
  Filled 2023-12-02 (×3): qty 90, 90d supply, fill #0
  Filled 2024-03-19 (×2): qty 90, 90d supply, fill #1

## 2023-12-03 ENCOUNTER — Other Ambulatory Visit (HOSPITAL_BASED_OUTPATIENT_CLINIC_OR_DEPARTMENT_OTHER): Payer: Self-pay

## 2023-12-03 MED ORDER — TRETINOIN 0.025 % EX CREA
1.0000 | TOPICAL_CREAM | Freq: Every day | CUTANEOUS | 3 refills | Status: AC
  Filled 2023-12-03: qty 20, 30d supply, fill #0

## 2023-12-10 ENCOUNTER — Encounter: Payer: 59 | Admitting: Physician Assistant

## 2023-12-18 ENCOUNTER — Encounter: Payer: Self-pay | Admitting: Physician Assistant

## 2023-12-18 ENCOUNTER — Ambulatory Visit (INDEPENDENT_AMBULATORY_CARE_PROVIDER_SITE_OTHER): Admitting: Physician Assistant

## 2023-12-18 VITALS — BP 120/76 | HR 46 | Temp 97.9°F | Ht 65.0 in | Wt 166.5 lb

## 2023-12-18 DIAGNOSIS — E663 Overweight: Secondary | ICD-10-CM

## 2023-12-18 DIAGNOSIS — Z0000 Encounter for general adult medical examination without abnormal findings: Secondary | ICD-10-CM | POA: Diagnosis not present

## 2023-12-18 DIAGNOSIS — F4323 Adjustment disorder with mixed anxiety and depressed mood: Secondary | ICD-10-CM

## 2023-12-18 NOTE — Progress Notes (Signed)
 Subjective:    Pamela Harper is a 38 y.o. female and is here for a comprehensive physical exam.  HPI  Health Maintenance Due  Topic Date Due   HPV VACCINES (1 - 3-dose SCDM series) Never done    Acute Concerns: No acute concerns reported today.  Chronic Issues: Anxiety / Depression  Pt is on Zoloft  50 mg daily. She is no longer seeing a Veterinary surgeon. Good compliance and tolerance. Mental health is stable. No acute concerns reported today.  Elevated A1c Pt reports her A1c was down to 5.5 on 4/10. Her A1c was 5.8 around Christmas time. She endorses continually losing weight, eating healthy, and regularly exercising. She has went down one clothing size.  Health Maintenance: Immunizations -- See above. Colonoscopy -- N/a Mammogram -- N/a PAP -- this is UpToDate -- we are requesting records Bone Density -- N/a Diet -- Overall healthy diet. Eating more protein and fiber. Exercise -- Weight training with a small group 3x per week, and walks on treadmill for 30 mins, 3x per week.  Sleep habits -- Good sleeping habits, 7-8 hours of sleep per night. Mood -- Stable.  UTD with dentist? - UTD UTD with eye doctor? - UTD  Weight history: Wt Readings from Last 10 Encounters:  12/18/23 166 lb 8 oz (75.5 kg)  03/25/23 169 lb (76.7 kg)  12/05/22 177 lb 6.1 oz (80.5 kg)  12/28/21 176 lb 6 oz (80 kg)  09/12/21 178 lb 9.6 oz (81 kg)  03/20/21 200 lb (90.7 kg)  03/08/21 200 lb (90.7 kg)  11/24/20 193 lb 3.2 oz (87.6 kg)  04/26/20 164 lb (74.4 kg)  02/29/20 164 lb (74.4 kg)   Body mass index is 27.71 kg/m. Patient's last menstrual period was 11/20/2023 (exact date).  Alcohol use:  reports that she does not currently use alcohol.  Tobacco use:  Tobacco Use: Low Risk  (12/18/2023)   Patient History    Smoking Tobacco Use: Never    Smokeless Tobacco Use: Never    Passive Exposure: Not on file   Eligible for lung cancer screening? no     03/25/2023   11:03 AM   Depression screen PHQ 2/9  Decreased Interest 1  Down, Depressed, Hopeless 1  PHQ - 2 Score 2  Altered sleeping 0  Tired, decreased energy 1  Change in appetite 1  Feeling bad or failure about yourself  0  Trouble concentrating 0  Moving slowly or fidgety/restless 0  Suicidal thoughts 0  PHQ-9 Score 4  Difficult doing work/chores Not difficult at all     Other providers/specialists: Patient Care Team: Job Lukes, [place] as PCP - General (Physician Assistant)    PMHx, SurgHx, SocialHx, Medications, and Allergies were reviewed in the Visit Navigator and updated as appropriate.   Past Medical History:  Diagnosis Date   Depression    Seasonal allergies      Past Surgical History:  Procedure Laterality Date   CESAREAN SECTION  2019   CESAREAN SECTION N/A 03/20/2021   Procedure: CESAREAN SECTION;  Surgeon: Delana Ted Morrison, DO;  Location: [facility];  Service: Obstetrics;  Laterality: N/A;  request RNFA or 2nd scrub tech   CHOLECYSTECTOMY  2008   PERINEAL LACERATION REPAIR       Family History  Problem Relation Age of Onset   Diabetes Mother    Sleep apnea Mother    Bipolar disorder Mother    Anxiety disorder Mother    Depression Mother    Asthma  Mother    Macular degeneration Mother    Hypothyroidism Mother    Hypertension Father    Multiple sclerosis Father    Depression Father    Sleep apnea Father    Hyperthyroidism Father    Anxiety disorder Sister    Heart failure Sister    Pancreatitis Maternal Grandmother    Atrial fibrillation Maternal Grandmother    Macular degeneration Maternal Grandfather    Diabetes Paternal Grandmother    Hyperlipidemia Paternal Grandmother    Hypertension Paternal Grandmother    Pancreatic cancer Paternal Grandfather     Social History   Tobacco Use   Smoking status: Never   Smokeless tobacco: Never  Vaping Use   Vaping status: Never Used  Substance Use Topics   Alcohol use: Not Currently   Drug use: Never     Review of Systems:   Review of Systems  Constitutional:  Negative for chills, fever, malaise/fatigue and weight loss.  HENT:  Negative for hearing loss, sinus pain and sore throat.   Respiratory:  Negative for cough and hemoptysis.   Cardiovascular:  Negative for chest pain, palpitations, leg swelling and PND.  Gastrointestinal:  Negative for abdominal pain, constipation, diarrhea, heartburn, nausea and vomiting.  Genitourinary:  Negative for dysuria, frequency and urgency.  Musculoskeletal:  Negative for back pain, myalgias and neck pain.  Skin:  Negative for itching and rash.  Neurological:  Negative for dizziness, tingling, seizures and headaches.  Endo/Heme/Allergies:  Negative for polydipsia.  Psychiatric/Behavioral:  Negative for depression. The patient is not nervous/anxious.      Objective:   BP 120/76 (BP Location: Left Arm, Patient Position: Sitting, Cuff Size: Normal)   Pulse (!) 46   Temp 97.9 F (36.6 C) (Temporal)   Ht 5' 5 (1.651 m)   Wt 166 lb 8 oz (75.5 kg)   LMP 11/20/2023 (Exact Date)   SpO2 98%   BMI 27.71 kg/m  Body mass index is 27.71 kg/m.   General Appearance:    Alert, cooperative, no distress, appears stated age  Head:    Normocephalic, without obvious abnormality, atraumatic  Eyes:    PERRL, conjunctiva/corneas clear, EOM's intact, fundi    benign, both eyes  Ears:    Normal TM's and external ear canals, both ears  Nose:   Nares normal, septum midline, mucosa normal, no drainage    or sinus tenderness  Throat:   Lips, mucosa, and tongue normal; teeth and gums normal  Neck:   Supple, symmetrical, trachea midline, no adenopathy;    thyroid:  no enlargement/tenderness/nodules; no carotid   bruit or JVD  Back:     Symmetric, no curvature, ROM normal, no CVA tenderness  Lungs:     Clear to auscultation bilaterally, respirations unlabored  Chest Wall:    No tenderness or deformity   Heart:    Regular rate and rhythm, S1 and S2 normal, no  murmur, rub or gallop  Breast Exam:    Deferred  Abdomen:     Soft, non-tender, bowel sounds active all four quadrants,    no masses, no organomegaly  Genitalia:    Deferred  Extremities:   Extremities normal, atraumatic, no cyanosis or edema  Pulses:   2+ and symmetric all extremities  Skin:   Skin color, texture, turgor normal, no rashes or lesions  Lymph nodes:   Cervical, supraclavicular, and axillary nodes normal  Neurologic:   CNII-XII intact, normal strength, sensation and reflexes    throughout    Assessment/Plan:  Routine physical examination Today patient counseled on age appropriate routine health concerns for screening and prevention, each reviewed and up to date or declined. Immunizations reviewed and up to date or declined. Labs ordered and reviewed. Risk factors for depression reviewed and negative. Hearing function and visual acuity are intact. ADLs screened and addressed as needed. Functional ability and level of safety reviewed and appropriate. Education, counseling and referrals performed based on assessed risks today. Patient provided with a copy of personalized plan for preventive services.  Overweight Suspect this BMI is skewed due to muscle mass Continue efforts at healthy lifestyle  Adjustment reaction with anxiety and depression Doing well with Zoloft  50 mg daily Follow up in 1 year, sooner if concerns I discussed with patient that if they develop any SI, to tell someone immediately and seek medical attention.    I, Lavern Simmers, acting as a Neurosurgeon for [name], [place]., have documented all relevant documentation on the behalf of Lucie Buttner, [place], as directed by Lucie Buttner, PA while in the presence of Lucie Buttner, [place].  I, Lucie Buttner, [place], have reviewed all documentation for this visit. The documentation on 12/18/23 for the exam, diagnosis, procedures, and orders are all accurate and complete.  Lucie Buttner, PA-C [facility]  [facility]

## 2023-12-18 NOTE — Patient Instructions (Addendum)
It was great to see you!  Please make an appointment with the lab on your way out. I would like for you to return for lab work within 1-2 weeks. After midnight on the day of the lab draw, please do not eat anything. You may have water, black coffee, unsweetened tea.  Take care,  Samantha   

## 2023-12-19 ENCOUNTER — Other Ambulatory Visit (INDEPENDENT_AMBULATORY_CARE_PROVIDER_SITE_OTHER)

## 2023-12-19 DIAGNOSIS — E663 Overweight: Secondary | ICD-10-CM

## 2023-12-19 DIAGNOSIS — Z0000 Encounter for general adult medical examination without abnormal findings: Secondary | ICD-10-CM

## 2023-12-19 LAB — CBC WITH DIFFERENTIAL/PLATELET
Basophils Absolute: 0 K/uL (ref 0.0–0.1)
Basophils Relative: 0.6 % (ref 0.0–3.0)
Eosinophils Absolute: 0.5 K/uL (ref 0.0–0.7)
Eosinophils Relative: 7.1 % — ABNORMAL HIGH (ref 0.0–5.0)
HCT: 39.5 % (ref 36.0–46.0)
Hemoglobin: 13 g/dL (ref 12.0–15.0)
Lymphocytes Relative: 38.1 % (ref 12.0–46.0)
Lymphs Abs: 2.7 K/uL (ref 0.7–4.0)
MCHC: 33 g/dL (ref 30.0–36.0)
MCV: 80.4 fl (ref 78.0–100.0)
Monocytes Absolute: 0.3 K/uL (ref 0.1–1.0)
Monocytes Relative: 4.2 % (ref 3.0–12.0)
Neutro Abs: 3.5 K/uL (ref 1.4–7.7)
Neutrophils Relative %: 50 % (ref 43.0–77.0)
Platelets: 274 K/uL (ref 150.0–400.0)
RBC: 4.91 Mil/uL (ref 3.87–5.11)
RDW: 14 % (ref 11.5–15.5)
WBC: 7 K/uL (ref 4.0–10.5)

## 2023-12-19 LAB — COMPREHENSIVE METABOLIC PANEL WITH GFR
ALT: 20 U/L (ref 0–35)
AST: 20 U/L (ref 0–37)
Albumin: 4.5 g/dL (ref 3.5–5.2)
Alkaline Phosphatase: 51 U/L (ref 39–117)
BUN: 21 mg/dL (ref 6–23)
CO2: 26 meq/L (ref 19–32)
Calcium: 9.1 mg/dL (ref 8.4–10.5)
Chloride: 102 meq/L (ref 96–112)
Creatinine, Ser: 0.81 mg/dL (ref 0.40–1.20)
GFR: 91.48 mL/min (ref >60.00)
Glucose, Bld: 90 mg/dL (ref 70–99)
Potassium: 4 meq/L (ref 3.5–5.1)
Sodium: 134 meq/L — ABNORMAL LOW (ref 135–145)
Total Bilirubin: 0.6 mg/dL (ref 0.2–1.2)
Total Protein: 7.5 g/dL (ref 6.0–8.3)

## 2023-12-19 LAB — LIPID PANEL
Cholesterol: 122 mg/dL (ref 0–200)
HDL: 52.8 mg/dL (ref >39.00)
LDL Cholesterol: 61 mg/dL (ref 0–99)
NonHDL: 69.11
Total CHOL/HDL Ratio: 2
Triglycerides: 40 mg/dL (ref 0.0–149.0)
VLDL: 8 mg/dL (ref 0.0–40.0)

## 2023-12-19 LAB — HEMOGLOBIN A1C: Hgb A1c MFr Bld: 5.9 % (ref 4.6–6.5)

## 2023-12-20 ENCOUNTER — Ambulatory Visit: Payer: Self-pay | Admitting: Physician Assistant

## 2023-12-20 DIAGNOSIS — R7309 Other abnormal glucose: Secondary | ICD-10-CM

## 2023-12-20 NOTE — Telephone Encounter (Signed)
 Pamela Harper, spoke to [name] at [place] lab, if they rerun the specimen they have the result will be the same. [name] also said pt's A1c's have been consistent each year with the result. Pt will need to have redrawn if she wants it rechecked.

## 2024-01-24 ENCOUNTER — Other Ambulatory Visit (HOSPITAL_BASED_OUTPATIENT_CLINIC_OR_DEPARTMENT_OTHER): Payer: Self-pay

## 2024-01-24 DIAGNOSIS — S43012A Anterior subluxation of left humerus, initial encounter: Secondary | ICD-10-CM | POA: Diagnosis not present

## 2024-01-24 MED ORDER — NAPROXEN 500 MG PO TABS
500.0000 mg | ORAL_TABLET | Freq: Two times a day (BID) | ORAL | 3 refills | Status: AC
  Filled 2024-01-24: qty 60, 30d supply, fill #0

## 2024-02-05 ENCOUNTER — Encounter (HOSPITAL_BASED_OUTPATIENT_CLINIC_OR_DEPARTMENT_OTHER): Payer: Self-pay | Admitting: Physical Therapy

## 2024-02-05 ENCOUNTER — Other Ambulatory Visit: Payer: Self-pay

## 2024-02-05 ENCOUNTER — Ambulatory Visit (HOSPITAL_BASED_OUTPATIENT_CLINIC_OR_DEPARTMENT_OTHER): Attending: Orthopedic Surgery | Admitting: Physical Therapy

## 2024-02-05 DIAGNOSIS — M6281 Muscle weakness (generalized): Secondary | ICD-10-CM | POA: Diagnosis not present

## 2024-02-05 DIAGNOSIS — M25511 Pain in right shoulder: Secondary | ICD-10-CM | POA: Insufficient documentation

## 2024-02-05 NOTE — Therapy (Signed)
 OUTPATIENT PHYSICAL THERAPY SHOULDER EVALUATION   Patient Name: Pamela Harper MRN: [id number] DOB:1985-06-16, 38 y.o., female Today's Date: 02/05/2024  END OF SESSION:  PT End of Session - 02/05/24 1705     Visit Number 1    Number of Visits 19    Date for PT Re-Evaluation 05/05/24    Authorization Type [place]    PT Start Time 1405    PT Stop Time 1445    PT Time Calculation (min) 40 min    Activity Tolerance Patient tolerated treatment well;Patient limited by pain    Behavior During Therapy [facility] for tasks assessed/performed          Past Medical History:  Diagnosis Date   Depression    Seasonal allergies    Past Surgical History:  Procedure Laterality Date   CESAREAN SECTION  2019   CESAREAN SECTION N/A 03/20/2021   Procedure: CESAREAN SECTION;  Surgeon: Delana Ted Morrison, DO;  Location: [facility];  Service: Obstetrics;  Laterality: N/A;  request RNFA or 2nd scrub tech   CHOLECYSTECTOMY  2008   PERINEAL LACERATION REPAIR     There are no active problems to display for this patient.   PCP: Job Lukes, PA   REFERRING PROVIDER:  Eudora Lonni RAMAN, PA-C     REFERRING DIAG: [phone] (ICD-10-CM) - Anterior subluxation of left humerus, initial encounter   THERAPY DIAG:  Right shoulder pain, unspecified chronicity  Muscle weakness (generalized)  Rationale for Evaluation and Treatment: Rehabilitation  ONSET DATE: 01/18/24 Date Injury   SUBJECTIVE:                                                                                                                                                                                      SUBJECTIVE STATEMENT: Pt reports this happened at the gym doing alternating side planks. Denies history of subluxations. Pt reports feeling the shoulder give out.  Felt the shoulder shift and go back into place. Did not have to reduce the shoulder herself. She immediately felt pain and instability with the left  shoulder.. She states that the left shoulder still has pain with movement such as reaching and overhead movements. Denies NT. Pt does have feelings of weakness and clicking (non painful). Did have catching but no longer there. Pain feels deep in shoulder. Stomach sleeper, has started sleeping on her back. Denies night pain.  Denies bruising or swelling. Shoulder feels sore and easily fatigued and tired. Pt requires lifting with her 3 young children.  Pt works out 3x/week with small group personal training. Uses exercise to manage her anxiety and is concerned about ability to exercise.   Hand dominance:  Right  PERTINENT HISTORY: N/A  PAIN:  Are you having pain? Yes: NPRS scale: 2/10 Pain location: anterior and lateral,  Pain description: sharp pain, soreness into the bicep, deltoid was sore/stiff Aggravating factors: doing bra (lacks range), lifting son, repeated motions Relieving factors: ice, naproxen    PRECAUTIONS: Shoulder  RED FLAGS: None   WEIGHT BEARING RESTRICTIONS: No  FALLS:  Has patient fallen in last 6 months? 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  DIAGNOSTIC FINDINGS:  N/A  PATIENT SURVEYS :    Extreme difficulty/unable (0), Quite a bit of difficulty (1), Moderate difficulty (2), Little difficulty (3), No difficulty (4) Survey date:  9/3  Any of your usual work, household or school activities 3  2. Your usual hobbies, recreational/sport activities 2   3. Lifting a bag of groceries to waist level 3   4. Lifting a bag of groceries above your head 3  5. Grooming your hair 3  6. Pushing up on your hands (I.e. from bathtub or chair) 4  7. Preparing food (I.e. peeling/cutting) 4  8.  Driving  4  9. Vacuuming, sweeping, or raking 3  10. Dressing  3  11. Doing up buttons 4  12. Using tools/appliances 4  13. Opening doors 3  14. Cleaning  4  15. Tying or lacing shoes 4  16. Sleeping  3  17. Laundering clothes (I.e. washing, ironing, folding) 3  18. Opening a jar 4  19. Throwing a ball 4  20. Carrying a small suitcase with your affected limb.  3  Score total:  68/80      COGNITION: Overall cognitive status: Within functional limits for tasks assessed                                     SENSATION: WFL   POSTURE: WFL   UPPER EXTREMITY ROM:     WNL for A/PROM in all planes Pain recreated with clinician overpressure Hypermobility noted, especially with ER on R   UPPER EXTREMITY MMT:   MMT Right 9/3  Left 9/3  Painful throughout  Shoulder flexion 44.6 38.8  Shoulder extension 47.4 40.8  Shoulder abduction 40.1 29.6  Shoulder adduction    Shoulder internal rotation    Shoulder external rotation 26.2 20.1  Elbow flexion    Elbow extension    Wrist flexion    Wrist extension    Wrist ulnar deviation    Wrist radial deviation    Wrist pronation    Wrist supination    (Blank rows = not tested)      PALPATION: TTP of posterior shoulder, delt, biceps, and UT  No laxity with inf or posterior shoulder mob   Special tests: (-) HK (-) painful arc (-) speed's (-) ER resist (-) belly press (-) lift off (-) neer (-) empty can (-) full can) (-) horn blowers (-) crank (-) biceps load II (-) apprehension relocation  TODAY'S TREATMENT:     Program Notes 3 way endurance red band holds (open palms, stick up position, Frankenstein position)  10s 3x in all 3 positionsAvoid: bench press, shoulder/overhead pressing, hanging/pull ups, clean, jerks, snatches, impact activity, planking  positions (unless static), shoulder 90/90 positions with load  Exercises - Standing Shoulder Diagonal Horizontal Abduction 60/120 Degrees with Resistance  - 1 x daily -  7 x weekly - 3 sets - 3 reps - Standing Single Arm Shoulder Abduction with Resistance  - 1 x daily - 7 x weekly - 3 sets - 5 reps - 5 hold    PATIENT EDUCATION: Education details: MOI, safety, cryotherapy, joint protection, diagnosis, prognosis, anatomy, exercise progression, DOMS expectations, muscle firing,  envelope of function, HEP, POC Person educated: Patient Education method: Explanation, Demonstration, Tactile cues, Verbal cues, and Handouts Education comprehension: verbalized understanding, returned demonstration, verbal cues required, tactile cues required, and needs further education     HOME EXERCISE PROGRAM:   Access Code: [email or web address] URL: [email or web address] Date: 02/05/2024 Prepared by: Dale Call    ASSESSMENT:   CLINICAL IMPRESSION: Patient is a 38 y.o. female who was seen today for physical therapy evaluation and treatment s/p suspected L posterior shoulder subluxation. Pt has strength deficits and pain after recent injury. Pt is most limited by strength and stability of the L shoulder as the cuff and deltoid complex are very sensitize and irritable with strength testing. Clinical testing does not suggest internal derangement at this time. Pt advised on safety with gym exercise and is able to start gradual return to exercise with precautions specified above. Pt gave verbal understanding to precautions and instructions. Pt pain is moderately sensitive at this time. Plan to continue with shoulder stability as tolerated keeping posterior dislocation precautions in mind. Pt would benefit from continued skilled therapy in order to reach goals and maximize functional L UE strength and ROM for full return to PLOF, exercise, and caregiver duties.      OBJECTIVE IMPAIRMENTS decreased activity tolerance, decreased ROM, decreased strength, increased muscle spasms, impaired UE functional use, improper body mechanics, postural dysfunction, and pain.    ACTIVITY LIMITATIONS  cleaning, lifting, bending, carry, dressing, bathing, feeding, community activity, meal prep, laundry, and yard work.    PERSONAL FACTORS 1-2 comorbidities: are affecting patient's functional outcome.     REHAB POTENTIAL: Good   CLINICAL DECISION MAKING: Stable/uncomplicated   EVALUATION COMPLEXITY: Low     GOALS:     SHORT TERM GOALS: Target date: 03/18/2024       Pt will become independent with HEP in order to demonstrate synthesis of PT education.   Goal status: INITIAL   2.  Pt will be able to demonstrate full APROM without pain  in order to demonstrate functional improvement in UE for progression to next phase of protocol.    Goal status: INITIAL   3.  Pt will report at least 2 pt reduction on NPRS scale for pain in order to demonstrate functional improvement with household activity, self care, and ADL.    Goal status: INITIAL     Goal status: INITIAL     LONG TERM GOALS: Target date: 05/05/2024          Pt  will become independent with final HEP in order to demonstrate synthesis of PT education.   Goal status: INITIAL   2.  Pt will be able to reach [facility] and carry/hold >10lbs in order to demonstrate functional improvement in L UE strength for return to normalized ADL and exercise.    Goal status: INITIAL   3. Pt will be able to demonstrate at least 90% HHD testing of L vs R shoulder in order to demonstrate functional improvement in UE function for return to normalized function  and strength for exercise and caregiver duties.      Goal status: INITIAL   4. Pt will have an at least 9 pt improvement in UEFS measure in order to demonstrate MCID improvement in daily function.    Goal status: INITIAL      PLAN: PT FREQUENCY: 1-2x/week   PT DURATION: 12 weeks   PLANNED INTERVENTIONS: Therapeutic exercises, Therapeutic activity, Neuromuscular re-education, Patient/Family education, Joint mobilization, Dry Needling, Spinal mobilization, Cryotherapy, Moist heat,  Taping, and Manual therapy, Re-evaluation   PLAN FOR NEXT SESSION:  resistance as tolerated of shoulder review precautions, start stability and cuff exercise, start gym exercise re-intro in setting of posterior sublux (pt would like to work on return to gym exercise modificaitons)    Dale Call, PT 02/05/2024, 5:09 PM

## 2024-02-06 ENCOUNTER — Encounter (HOSPITAL_BASED_OUTPATIENT_CLINIC_OR_DEPARTMENT_OTHER): Payer: Self-pay | Admitting: Physical Therapy

## 2024-02-12 ENCOUNTER — Encounter (HOSPITAL_BASED_OUTPATIENT_CLINIC_OR_DEPARTMENT_OTHER): Payer: Self-pay | Admitting: Physical Therapy

## 2024-02-12 ENCOUNTER — Ambulatory Visit (HOSPITAL_BASED_OUTPATIENT_CLINIC_OR_DEPARTMENT_OTHER): Payer: Self-pay | Admitting: Physical Therapy

## 2024-02-12 DIAGNOSIS — M6281 Muscle weakness (generalized): Secondary | ICD-10-CM | POA: Diagnosis not present

## 2024-02-12 DIAGNOSIS — M25511 Pain in right shoulder: Secondary | ICD-10-CM

## 2024-02-12 NOTE — Therapy (Signed)
 OUTPATIENT PHYSICAL THERAPY SHOULDER EVALUATION   Patient Name: Pamela Harper MRN: [id number] DOB:03/02/1986, 38 y.o., female Today's Date: 02/12/2024  END OF SESSION:  PT End of Session - 02/12/24 1040     Visit Number 2    Number of Visits 19    Date for PT Re-Evaluation 05/05/24    Authorization Type [place]    PT Start Time 1020    PT Stop Time 1058    PT Time Calculation (min) 38 min    Activity Tolerance Patient tolerated treatment well;Patient limited by pain    Behavior During Therapy [facility] for tasks assessed/performed           Past Medical History:  Diagnosis Date   Depression    Seasonal allergies    Past Surgical History:  Procedure Laterality Date   CESAREAN SECTION  2019   CESAREAN SECTION N/A 03/20/2021   Procedure: CESAREAN SECTION;  Surgeon: Delana Ted Morrison, DO;  Location: [facility] LD ORS;  Service: Obstetrics;  Laterality: N/A;  request RNFA or 2nd scrub tech   CHOLECYSTECTOMY  2008   PERINEAL LACERATION REPAIR     There are no active problems to display for this patient.   PCP: Job Lukes, PA   REFERRING PROVIDER:  Eudora Lonni RAMAN, PA-C     REFERRING DIAG: [phone] (ICD-10-CM) - Anterior subluxation of left humerus, initial encounter   THERAPY DIAG:  Right shoulder pain, unspecified chronicity  Muscle weakness (generalized)  Rationale for Evaluation and Treatment: Rehabilitation  ONSET DATE: 01/18/24 Date Injury   SUBJECTIVE:                                                                                                                                                                                      SUBJECTIVE STATEMENT:  Pt reports improvement but still feels instability with reaching. Pt reports no pain at baseline.    Eval:  Pt reports this happened at the gym doing alternating side planks. Denies history of subluxations. Pt reports feeling the shoulder give out.  Felt the shoulder shift and go back  into place. Did not have to reduce the shoulder herself. She immediately felt pain and instability with the left shoulder.. She states that the left shoulder still has pain with movement such as reaching and overhead movements. Denies NT. Pt does have feelings of weakness and clicking (non painful). Did have catching but no longer there. Pain feels deep in shoulder. Stomach sleeper, has started sleeping on her back. Denies night pain.  Denies bruising or swelling. Shoulder feels sore and easily fatigued and tired. Pt requires lifting with her 3 young children.  Pt works out 3x/week  with small group personal training. Uses exercise to manage her anxiety and is concerned about ability to exercise.   Hand dominance: Right  PERTINENT HISTORY: N/A  PAIN:  Are you having pain? No: NPRS scale: 0/10 Pain location: anterior and lateral,  Pain description: sharp pain, soreness into the bicep, deltoid was sore/stiff Aggravating factors: doing bra (lacks range), lifting son, repeated motions Relieving factors: ice, naproxen   PRECAUTIONS: Shoulder  RED FLAGS: None   WEIGHT BEARING RESTRICTIONS: No  FALLS:  Has patient fallen in last 6 months? 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  DIAGNOSTIC FINDINGS:  N/A  PATIENT SURVEYS :    Extreme difficulty/unable (0), Quite a bit of difficulty (1), Moderate difficulty (2), Little difficulty (3), No difficulty (4) Survey date:  9/3  Any of your usual work, household or school activities 3  2. Your usual hobbies, recreational/sport activities 2   3. Lifting a bag of groceries to waist level 3   4. Lifting a bag of groceries above your head 3  5. Grooming your hair 3   6. Pushing up on your hands (I.e. from bathtub or chair) 4  7. Preparing food (I.e. peeling/cutting) 4  8. Driving  4  9. Vacuuming, sweeping, or raking 3  10. Dressing  3  11. Doing up buttons 4  12. Using tools/appliances 4  13. Opening doors 3  14. Cleaning  4  15. Tying or lacing shoes 4  16. Sleeping  3  17. Laundering clothes (I.e. washing, ironing, folding) 3  18. Opening a jar 4  19. Throwing a ball 4  20. Carrying a small suitcase with your affected limb.  3  Score total:  68/80      COGNITION: Overall cognitive status: Within functional limits for tasks assessed                                     SENSATION: WFL   POSTURE: WFL   UPPER EXTREMITY ROM:     WNL for A/PROM in all planes Pain recreated with clinician overpressure Hypermobility noted, especially with ER on R   UPPER EXTREMITY MMT:   MMT Right 9/3  Left 9/3  Painful throughout  Shoulder flexion 44.6 38.8  Shoulder extension 47.4 40.8  Shoulder abduction 40.1 29.6  Shoulder adduction    Shoulder internal rotation    Shoulder external rotation 26.2 20.1  Elbow flexion    Elbow extension    Wrist flexion    Wrist extension    Wrist ulnar deviation    Wrist radial deviation    Wrist pronation    Wrist supination    (Blank rows = not tested)      PALPATION: TTP of posterior shoulder, delt, biceps, and UT  No laxity with inf or posterior shoulder mob   Special tests: (-) HK (-) painful arc (-) speed's (-) ER resist (-) belly press (-) lift off (-) neer (-) empty can (-) full can) (-) horn blowers (-) crank (-) biceps load II (-) apprehension relocation  TODAY'S TREATMENT:   9/10  UBE 2 min fwd and retro  Unilatreal cable row 20lbs 3x8  Shoulder external rotation elbow propped 3x8 5lb Prone shoulder extension, Y, T 3x8 5lbs  Table push up plus 3x8 Side plank 5s 3x5     Previous:     Program Notes 3 way endurance red band holds (open palms, stick up position,  Frankenstein position)  10s 3x in all 3 positionsAvoid: bench press, shoulder/overhead pressing, hanging/pull ups, clean, jerks, snatches, impact activity, planking positions (unless static), shoulder 90/90 positions with load  Exercises - Standing Shoulder Diagonal Horizontal Abduction 60/120 Degrees with Resistance  - 1 x daily - 7 x weekly - 3 sets - 3 reps - Standing Single Arm Shoulder Abduction with Resistance  - 1 x daily - 7 x weekly - 3 sets - 5 reps - 5 hold    PATIENT EDUCATION: Education details: protection, diagnosis, prognosis, anatomy, exercise progression, DOMS expectations, muscle firing,  envelope of function, HEP, POC Person educated: Patient Education method: Explanation, Demonstration, Tactile cues, Verbal cues, and Handouts Education comprehension: verbalized understanding, returned demonstration, verbal cues required, tactile cues required, and needs further education     HOME EXERCISE PROGRAM:   Access Code: [email or web address] URL: [email or web address] Date: 02/05/2024 Prepared by: Dale Call    ASSESSMENT:   CLINICAL IMPRESSION:  Pt able to progress cuff strenthening exercise and even able to start static planking positions without increased pain. Pt does have increased fatigue as expected at scapular and GHJ stabilizers but no discomfort during session. HEP updated today accordingly. Pt advised she may do a row but to avoid a heavy bent over row with L arm proppped to prevent posterior translation forces. Plan to work into weight room at next with bottoms up holds, floor pressing, and serratus exercise.   Eval:  Patient is a 38 y.o. female who was seen today for physical therapy evaluation and treatment s/p suspected L posterior shoulder subluxation. Pt has strength deficits and pain after recent injury. Pt is most limited by strength and stability of the L shoulder as the cuff and deltoid complex are very sensitize and irritable with strength testing.  Clinical testing does not suggest internal derangement at this time. Pt advised on safety with gym exercise and is able to start gradual return to exercise with precautions specified above. Pt gave verbal understanding to precautions and instructions. Pt pain is moderately sensitive at this time. Plan to continue with shoulder stability as tolerated keeping posterior dislocation precautions in mind. Pt would benefit from continued skilled therapy in order to reach goals and maximize functional L UE strength and ROM for full return to PLOF, exercise, and caregiver duties.      OBJECTIVE IMPAIRMENTS decreased activity tolerance, decreased ROM, decreased strength, increased muscle spasms, impaired UE functional use, improper body mechanics, postural dysfunction, and pain.    ACTIVITY LIMITATIONS cleaning, lifting, bending, carry, dressing, bathing, feeding, community activity, meal prep, laundry, and yard work.    PERSONAL FACTORS 1-2 comorbidities: are affecting patient's functional outcome.     REHAB POTENTIAL: Good   CLINICAL DECISION MAKING: Stable/uncomplicated   EVALUATION COMPLEXITY: Low     GOALS:     SHORT TERM GOALS: Target date: 03/18/2024       Pt will become independent with HEP in order to demonstrate synthesis of PT education.   Goal status: INITIAL   2.  Pt will be able to demonstrate full APROM without pain  in order to demonstrate functional improvement in UE for progression to next phase of protocol.    Goal status: INITIAL   3.  Pt  will report at least 2 pt reduction on NPRS scale for pain in order to demonstrate functional improvement with household activity, self care, and ADL.    Goal status: INITIAL     Goal status: INITIAL     LONG TERM GOALS: Target date: 05/05/2024          Pt  will become independent with final HEP in order to demonstrate synthesis of PT education.   Goal status: INITIAL   2.  Pt will be able to reach [facility] and carry/hold >10lbs in  order to demonstrate functional improvement in L UE strength for return to normalized ADL and exercise.    Goal status: INITIAL   3. Pt will be able to demonstrate at least 90% HHD testing of L vs R shoulder in order to demonstrate functional improvement in UE function for return to normalized function and strength for exercise and caregiver duties.      Goal status: INITIAL   4. Pt will have an at least 9 pt improvement in UEFS measure in order to demonstrate MCID improvement in daily function.    Goal status: INITIAL      PLAN: PT FREQUENCY: 1-2x/week   PT DURATION: 12 weeks   PLANNED INTERVENTIONS: Therapeutic exercises, Therapeutic activity, Neuromuscular re-education, Patient/Family education, Joint mobilization, Dry Needling, Spinal mobilization, Cryotherapy, Moist heat, Taping, and Manual therapy, Re-evaluation   PLAN FOR NEXT SESSION:  resistance as tolerated of shoulder review precautions, start stability and cuff exercise, start gym exercise re-intro in setting of posterior sublux (pt would like to work on return to gym exercise modificaitons)    Dale Call, PT 02/12/2024, 11:02 AM

## 2024-02-14 ENCOUNTER — Encounter: Payer: Self-pay | Admitting: Podiatry

## 2024-02-14 ENCOUNTER — Ambulatory Visit (INDEPENDENT_AMBULATORY_CARE_PROVIDER_SITE_OTHER): Admitting: Podiatry

## 2024-02-14 ENCOUNTER — Other Ambulatory Visit (HOSPITAL_BASED_OUTPATIENT_CLINIC_OR_DEPARTMENT_OTHER): Payer: Self-pay

## 2024-02-14 ENCOUNTER — Ambulatory Visit

## 2024-02-14 DIAGNOSIS — L600 Ingrowing nail: Secondary | ICD-10-CM | POA: Diagnosis not present

## 2024-02-14 DIAGNOSIS — M7752 Other enthesopathy of left foot: Secondary | ICD-10-CM

## 2024-02-14 MED ORDER — CEPHALEXIN 500 MG PO CAPS
500.0000 mg | ORAL_CAPSULE | Freq: Three times a day (TID) | ORAL | 0 refills | Status: AC
  Filled 2024-02-14: qty 21, 7d supply, fill #0

## 2024-02-14 NOTE — Progress Notes (Signed)
 Chief Complaint  Patient presents with   Nail Problem    L Great toe nail medial sore and possible infection.  Pedicure 3 weeks ago. Toe was sore red and had some pus.  Has been soaking w epson and feeling much better.  Non diabetic no anti coag.    HPI: 38 y.o. female presents today with concern about possible ingrown toenail left first toe.  She states that she had a pedicure a about a month ago.  Developed some soreness, redness, swelling and irritation to the left hallux medial nail border.  She started soaking with Epsom salts and actually noticed some purulent appearing drainage.  She states that it has improved significantly in that time and reports minimal pain presently.  Past Medical History:  Diagnosis Date   Depression    Seasonal allergies     Past Surgical History:  Procedure Laterality Date   CESAREAN SECTION  2019   CESAREAN SECTION N/A 03/20/2021   Procedure: CESAREAN SECTION;  Surgeon: Delana Ted Morrison, DO;  Location: [facility] LD ORS;  Service: Obstetrics;  Laterality: N/A;  request RNFA or 2nd scrub tech   CHOLECYSTECTOMY  2008   PERINEAL LACERATION REPAIR      Allergies  Allergen Reactions   Codeine Hives    ROS denies any nausea, vomiting, fever, chills, chest pain, shortness of breath.   Physical Exam: There were no vitals filed for this visit.  General: The patient is alert and oriented x3 in no acute distress.  Dermatology: Skin is warm, dry and supple bilateral lower extremities. Interspaces are clear of maceration and debris.  Very mild localized erythema about the left hallux medial nail border.  Mild edema accompanying this.  Minimal tenderness on palpation.  There is some hyperkeratotic tissue to the left hallux medial nail border.  There is some slight incurvation of the left first toe medial border  Vascular: Palpable pedal pulses bilaterally. Capillary refill within normal limits.  No appreciable edema.  No erythema or  calor.  Neurological: Light touch sensation grossly intact bilateral feet.   Musculoskeletal Exam: Muscle strength 5/5 in all major muscle groups.  No symptomatic notations in pedal range of motion.  No gross orthopedic deformities.  Assessment/Plan of Care: 1. Ingrown toenail of left foot      Meds ordered this encounter  Medications   cephALEXin  (KEFLEX ) 500 MG capsule    Sig: Take 1 capsule (500 mg total) by mouth 3 (three) times daily for 7 days.    Dispense:  21 capsule    Refill:  0   None  Discussed clinical findings with patient today.  Likely dealing with mild ingrown toenail that has improved significantly with some home care.  We discussed further home care for this and today palliatively debrided the left hallux medial nail border callus as a courtesy to patient tolerance without incident.  Did apply a small amount of antibiotic ointment to the area to help keep the skin soft and covered with bandage.  Instructed patient to continue with Epsom salt soaks, antibacterial ointment application, can try packing small amount of cotton to the nail border rolled with antibacterial ointment which may help encourage the nail to elevate out of the corner somewhat.  Due to reporting some purulence and some signs of localized irritation, sending 7-day course of Keflex  to patient's pharmacy out of abundance of caution.  Return precautions discussed with patient.  Follow-up as needed.   Conda Wannamaker L. Lamount, DPM, AACFAS  [facility]     [address] [place], [place] [address]                Office [phone]  Fax [phone]

## 2024-02-14 NOTE — Patient Instructions (Signed)
 Continue to soak your foot in warm Epsom salts for 15 to 20 minutes a day twice a day.  You can also apply a small amount of antibacterial ointment to the side of the nail border that is causing irritation keep covered with a Band-Aid to help soften the skin up.  Try to take a small piece of cotton from a cotton ball, roll an antibacterial ointment and gently pack or tuck it underneath the affected nail corner and this will encourage the nail to lift outwards.  Do this 1-2 times a day over the next 2 to 3 weeks.  You can also scrub the callus that forms along the nail border with white vinegar for several minutes a day using a toothbrush and this will make it easier for you to file it down using a nail file.  If your pain is ongoing or lingering for more than a couple weeks I recommend that you come back for an ingrown toenail procedure.

## 2024-02-15 ENCOUNTER — Ambulatory Visit (HOSPITAL_BASED_OUTPATIENT_CLINIC_OR_DEPARTMENT_OTHER): Admitting: Physical Therapy

## 2024-03-02 ENCOUNTER — Ambulatory Visit (HOSPITAL_BASED_OUTPATIENT_CLINIC_OR_DEPARTMENT_OTHER): Admitting: Physical Therapy

## 2024-03-02 ENCOUNTER — Encounter (HOSPITAL_BASED_OUTPATIENT_CLINIC_OR_DEPARTMENT_OTHER): Payer: Self-pay | Admitting: Physical Therapy

## 2024-03-02 DIAGNOSIS — M6281 Muscle weakness (generalized): Secondary | ICD-10-CM | POA: Diagnosis not present

## 2024-03-02 DIAGNOSIS — M25511 Pain in right shoulder: Secondary | ICD-10-CM | POA: Diagnosis not present

## 2024-03-02 NOTE — Therapy (Signed)
 OUTPATIENT PHYSICAL THERAPY SHOULDER EVALUATION   Patient Name: Pamela Harper MRN: [id number] DOB:10-24-1985, 38 y.o., female Today's Date: 03/02/2024  END OF SESSION:  PT End of Session - 03/02/24 1223     Visit Number 3    Number of Visits 19    Date for Recertification  05/05/24    Authorization Type [employer]    PT Start Time 1145    PT Stop Time 1225    PT Time Calculation (min) 40 min    Activity Tolerance Patient tolerated treatment well;Patient limited by pain    Behavior During Therapy [facility] for tasks assessed/performed            Past Medical History:  Diagnosis Date   Depression    Seasonal allergies    Past Surgical History:  Procedure Laterality Date   CESAREAN SECTION  2019   CESAREAN SECTION N/A 03/20/2021   Procedure: CESAREAN SECTION;  Surgeon: Delana Ted Morrison, DO;  Location: [facility] LD ORS;  Service: Obstetrics;  Laterality: N/A;  request RNFA or 2nd scrub tech   CHOLECYSTECTOMY  2008   PERINEAL LACERATION REPAIR     There are no active problems to display for this patient.   PCP: Job Lukes, PA   REFERRING PROVIDER:  Eudora Lonni RAMAN, PA-C     REFERRING DIAG: [phone] (ICD-10-CM) - Anterior subluxation of left humerus, initial encounter   THERAPY DIAG:  Right shoulder pain, unspecified chronicity  Muscle weakness (generalized)  Rationale for Evaluation and Treatment: Rehabilitation  ONSET DATE: 01/18/24 Date Injury   SUBJECTIVE:                                                                                                                                                                                      SUBJECTIVE STATEMENT:  Pt reports no pain since last session. She is back in the gym with lighter weight but trying more movements. Pt reports more stability but still feels weaker on that arm. She has been able to lift her child without pain. Pt is about 50% back to normal.    Eval:  Pt reports this happened  at the gym doing alternating side planks. Denies history of subluxations. Pt reports feeling the shoulder give out.  Felt the shoulder shift and go back into place. Did not have to reduce the shoulder herself. She immediately felt pain and instability with the left shoulder.. She states that the left shoulder still has pain with movement such as reaching and overhead movements. Denies NT. Pt does have feelings of weakness and clicking (non painful). Did have catching but no longer there. Pain feels deep in shoulder. Stomach sleeper, has  started sleeping on her back. Denies night pain.  Denies bruising or swelling. Shoulder feels sore and easily fatigued and tired. Pt requires lifting with her 3 young children.  Pt works out 3x/week with small group personal training. Uses exercise to manage her anxiety and is concerned about ability to exercise.   Hand dominance: Right  PERTINENT HISTORY: N/A  PAIN:  Are you having pain? No: NPRS scale: 0/10 Pain location: anterior and lateral,  Pain description: sharp pain, soreness into the bicep, deltoid was sore/stiff Aggravating factors: doing bra (lacks range), lifting son, repeated motions Relieving factors: ice, naproxen   PRECAUTIONS: Shoulder  RED FLAGS: None   WEIGHT BEARING RESTRICTIONS: No  FALLS:  Has patient fallen in last 6 months? 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  DIAGNOSTIC FINDINGS:  N/A  PATIENT SURVEYS :    Extreme difficulty/unable (0), Quite a bit of difficulty (1), Moderate difficulty (2), Little difficulty (3), No difficulty (4) Survey date:  9/3  Any of your usual work, household or school activities 3  2. Your usual hobbies,  recreational/sport activities 2   3. Lifting a bag of groceries to waist level 3   4. Lifting a bag of groceries above your head 3  5. Grooming your hair 3  6. Pushing up on your hands (I.e. from bathtub or chair) 4  7. Preparing food (I.e. peeling/cutting) 4  8. Driving  4  9. Vacuuming, sweeping, or raking 3  10. Dressing  3  11. Doing up buttons 4  12. Using tools/appliances 4  13. Opening doors 3  14. Cleaning  4  15. Tying or lacing shoes 4  16. Sleeping  3  17. Laundering clothes (I.e. washing, ironing, folding) 3  18. Opening a jar 4  19. Throwing a ball 4  20. Carrying a small suitcase with your affected limb.  3  Score total:  68/80      COGNITION: Overall cognitive status: Within functional limits for tasks assessed                                     SENSATION: WFL   POSTURE: WFL   UPPER EXTREMITY ROM:     WNL for A/PROM in all planes Pain recreated with clinician overpressure Hypermobility noted, especially with ER on R   UPPER EXTREMITY MMT:   MMT Right 9/3  Left 9/3  Painful throughout  Shoulder flexion 44.6 38.8  Shoulder extension 47.4 40.8  Shoulder abduction 40.1 29.6  Shoulder adduction    Shoulder internal rotation    Shoulder external rotation 26.2 20.1  Elbow flexion    Elbow extension    Wrist flexion    Wrist extension    Wrist ulnar deviation    Wrist radial deviation    Wrist pronation    Wrist supination    (Blank rows = not tested)      PALPATION: TTP of posterior shoulder, delt, biceps, and UT  No laxity with inf or posterior shoulder mob   Special tests: (-) HK (-) painful arc (-) speed's (-) ER resist (-) belly press (-) lift off (-) neer (-) empty can (-)  full can) (-) horn blowers (-) crank (-) biceps load II (-) apprehension relocation  TODAY'S TREATMENT:   9/29  UBE 2 min fwd and retro  Floor press 3x8 20lbs  15lbs turkish prop 45 20lb SA punch 3x8 Bear position shoulder taps 4x10 Side plank  4x5 3s    9/10  UBE 2 min fwd and retro  Unilatreal cable row 20lbs 3x8  Shoulder external rotation elbow propped 3x8 5lb Prone shoulder extension, Y, T 3x8 5lbs  Table push up plus 3x8 Side plank 5s 3x5     Previous:     Program Notes 3 way endurance red band holds (open palms, stick up position, Frankenstein position)  10s 3x in all 3 positionsAvoid: bench press, shoulder/overhead pressing, hanging/pull ups, clean, jerks, snatches, impact activity, planking positions (unless static), shoulder 90/90 positions with load  Exercises - Standing Shoulder Diagonal Horizontal Abduction 60/120 Degrees with Resistance  - 1 x daily - 7 x weekly - 3 sets - 3 reps - Standing Single Arm Shoulder Abduction with Resistance  - 1 x daily - 7 x weekly - 3 sets - 5 reps - 5 hold    PATIENT EDUCATION: Education details: protection, diagnosis, prognosis, anatomy, exercise progression, DOMS expectations, muscle firing,  envelope of function, HEP, POC Person educated: Patient Education method: Explanation, Demonstration, Tactile cues, Verbal cues, and Handouts Education comprehension: verbalized understanding, returned demonstration, verbal cues required, tactile cues required, and needs further education     HOME EXERCISE PROGRAM:   Access Code: [email or web address] URL: [email or web address] Date: 02/05/2024 Prepared by: Dale Call    ASSESSMENT:   CLINICAL IMPRESSION:  Patient able to progress functional shoulder stability exercise today and close chain and open chain without increasing pain or feelings of instability.  Patient able to tolerate roughly 50% loading as compared to her previous exercise in the gym without increase in feelings of instability.  Home exercise program updated today for higher grade rotator cuff strength and glenohumeral stability requirements.  Plan to decrease frequency of visits at this time as instructions have been provided to patient regarding safe gym  exercise currently.  Patient still to avoid overhead motions, hanging, as well as Olympic style lifting.  Consider bottoms up pressing, heavy rowing, assisted pull-ups, and/or Scap push-ups at next session Eval:  Patient is a 38 y.o. female who was seen today for physical therapy evaluation and treatment s/p suspected L posterior shoulder subluxation. Pt has strength deficits and pain after recent injury. Pt is most limited by strength and stability of the L shoulder as the cuff and deltoid complex are very sensitize and irritable with strength testing. Clinical testing does not suggest internal derangement at this time. Pt advised on safety with gym exercise and is able to start gradual return to exercise with precautions specified above. Pt gave verbal understanding to precautions and instructions. Pt pain is moderately sensitive at this time. Plan to continue with shoulder stability as tolerated keeping posterior dislocation precautions in mind. Pt would benefit from continued skilled therapy in order to reach goals and maximize functional L UE strength and ROM for full return to PLOF, exercise, and caregiver duties.      OBJECTIVE IMPAIRMENTS decreased activity tolerance, decreased ROM, decreased strength, increased muscle spasms, impaired UE functional use, improper body mechanics, postural dysfunction, and pain.    ACTIVITY LIMITATIONS cleaning, lifting, bending, carry, dressing, bathing, feeding, community activity, meal prep, laundry, and yard work.    PERSONAL FACTORS 1-2 comorbidities: are affecting patient's functional outcome.  REHAB POTENTIAL: Good   CLINICAL DECISION MAKING: Stable/uncomplicated   EVALUATION COMPLEXITY: Low     GOALS:     SHORT TERM GOALS: Target date: 03/18/2024       Pt will become independent with HEP in order to demonstrate synthesis of PT education.   Goal status: INITIAL   2.  Pt will be able to demonstrate full APROM without pain  in order to  demonstrate functional improvement in UE for progression to next phase of protocol.    Goal status: INITIAL   3.  Pt will report at least 2 pt reduction on NPRS scale for pain in order to demonstrate functional improvement with household activity, self care, and ADL.    Goal status: INITIAL     Goal status: INITIAL     LONG TERM GOALS: Target date: 05/05/2024          Pt  will become independent with final HEP in order to demonstrate synthesis of PT education.   Goal status: INITIAL   2.  Pt will be able to reach [facility] and carry/hold >10lbs in order to demonstrate functional improvement in L UE strength for return to normalized ADL and exercise.    Goal status: INITIAL   3. Pt will be able to demonstrate at least 90% HHD testing of L vs R shoulder in order to demonstrate functional improvement in UE function for return to normalized function and strength for exercise and caregiver duties.      Goal status: INITIAL   4. Pt will have an at least 9 pt improvement in UEFS measure in order to demonstrate MCID improvement in daily function.    Goal status: INITIAL      PLAN: PT FREQUENCY: 1-2x/week   PT DURATION: 12 weeks   PLANNED INTERVENTIONS: Therapeutic exercises, Therapeutic activity, Neuromuscular re-education, Patient/Family education, Joint mobilization, Dry Needling, Spinal mobilization, Cryotherapy, Moist heat, Taping, and Manual therapy, Re-evaluation   PLAN FOR NEXT SESSION:  resistance as tolerated of shoulder review precautions, start stability and cuff exercise, start gym exercise re-intro in setting of posterior sublux (pt would like to work on return to gym exercise modificaitons)    Dale Call, PT 03/02/2024, 12:35 PM

## 2024-03-11 ENCOUNTER — Encounter (HOSPITAL_BASED_OUTPATIENT_CLINIC_OR_DEPARTMENT_OTHER): Admitting: Physical Therapy

## 2024-03-16 ENCOUNTER — Ambulatory Visit (HOSPITAL_BASED_OUTPATIENT_CLINIC_OR_DEPARTMENT_OTHER): Attending: Orthopedic Surgery | Admitting: Physical Therapy

## 2024-03-16 DIAGNOSIS — M25511 Pain in right shoulder: Secondary | ICD-10-CM | POA: Diagnosis not present

## 2024-03-16 DIAGNOSIS — M6281 Muscle weakness (generalized): Secondary | ICD-10-CM | POA: Diagnosis not present

## 2024-03-16 NOTE — Therapy (Signed)
 OUTPATIENT PHYSICAL THERAPY SHOULDER EVALUATION   Patient Name: Pamela Harper MRN: [id number] DOB:07/09/1985, 38 y.o., female Today's Date: 03/16/2024  END OF SESSION:  PT End of Session - 03/16/24 0939     Visit Number 4    Number of Visits 19    Date for Recertification  05/05/24    Authorization Type [employer]    PT Start Time 0933    PT Stop Time 1017    PT Time Calculation (min) 44 min    Activity Tolerance Patient tolerated treatment well;Patient limited by pain    Behavior During Therapy [facility] for tasks assessed/performed             Past Medical History:  Diagnosis Date   Depression    Seasonal allergies    Past Surgical History:  Procedure Laterality Date   CESAREAN SECTION  2019   CESAREAN SECTION N/A 03/20/2021   Procedure: CESAREAN SECTION;  Surgeon: Delana Ted Morrison, DO;  Location: [facility] LD ORS;  Service: Obstetrics;  Laterality: N/A;  request RNFA or 2nd scrub tech   CHOLECYSTECTOMY  2008   PERINEAL LACERATION REPAIR     There are no active problems to display for this patient.   PCP: Job Lukes, PA   REFERRING PROVIDER:  Eudora Lonni RAMAN, PA-C     REFERRING DIAG: [phone] (ICD-10-CM) - Anterior subluxation of left humerus, initial encounter   THERAPY DIAG:  Right shoulder pain, unspecified chronicity  Muscle weakness (generalized)  Rationale for Evaluation and Treatment: Rehabilitation  ONSET DATE: 01/18/24 Date Injury   SUBJECTIVE:                                                                                                                                                                                      SUBJECTIVE STATEMENT:  Pt reports the L still fatigues faster than R. Denies subluxations denies feeling pain with ADL. OH exercise from last time does feel unstable and causes apprehension.    Eval:  Pt reports this happened at the gym doing alternating side planks. Denies history of subluxations. Pt  reports feeling the shoulder give out.  Felt the shoulder shift and go back into place. Did not have to reduce the shoulder herself. She immediately felt pain and instability with the left shoulder.. She states that the left shoulder still has pain with movement such as reaching and overhead movements. Denies NT. Pt does have feelings of weakness and clicking (non painful). Did have catching but no longer there. Pain feels deep in shoulder. Stomach sleeper, has started sleeping on her back. Denies night pain.  Denies bruising or swelling. Shoulder feels sore and easily fatigued and  tired. Pt requires lifting with her 3 young children.  Pt works out 3x/week with small group personal training. Uses exercise to manage her anxiety and is concerned about ability to exercise.   Hand dominance: Right  PERTINENT HISTORY: N/A  PAIN:  Are you having pain? No: NPRS scale: 0/10 Pain location: anterior and lateral,  Pain description: sharp pain, soreness into the bicep, deltoid was sore/stiff Aggravating factors: doing bra (lacks range), lifting son, repeated motions Relieving factors: ice, naproxen   PRECAUTIONS: Shoulder  RED FLAGS: None   WEIGHT BEARING RESTRICTIONS: No  FALLS:  Has patient fallen in last 6 months? 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  DIAGNOSTIC FINDINGS:  N/A  PATIENT SURVEYS :    Extreme difficulty/unable (0), Quite a bit of difficulty (1), Moderate difficulty (2), Little difficulty (3), No difficulty (4) Survey date:  9/3  Any of your usual work, household or school activities 3  2. Your usual hobbies, recreational/sport activities 2   3. Lifting a bag of groceries to waist level 3    4. Lifting a bag of groceries above your head 3  5. Grooming your hair 3  6. Pushing up on your hands (I.e. from bathtub or chair) 4  7. Preparing food (I.e. peeling/cutting) 4  8. Driving  4  9. Vacuuming, sweeping, or raking 3  10. Dressing  3  11. Doing up buttons 4  12. Using tools/appliances 4  13. Opening doors 3  14. Cleaning  4  15. Tying or lacing shoes 4  16. Sleeping  3  17. Laundering clothes (I.e. washing, ironing, folding) 3  18. Opening a jar 4  19. Throwing a ball 4  20. Carrying a small suitcase with your affected limb.  3  Score total:  68/80      COGNITION: Overall cognitive status: Within functional limits for tasks assessed                                     SENSATION: WFL   POSTURE: WFL   UPPER EXTREMITY ROM:     WNL for A/PROM in all planes Pain recreated with clinician overpressure Hypermobility noted, especially with ER on R   UPPER EXTREMITY MMT:   MMT Right 9/3  Left 9/3  Painful throughout  Shoulder flexion 44.6 38.8  Shoulder extension 47.4 40.8  Shoulder abduction 40.1 29.6  Shoulder adduction    Shoulder internal rotation    Shoulder external rotation 26.2 20.1  Elbow flexion    Elbow extension    Wrist flexion    Wrist extension    Wrist ulnar deviation    Wrist radial deviation    Wrist pronation    Wrist supination    (Blank rows = not tested)      PALPATION: TTP of posterior shoulder, delt, biceps, and UT  No laxity with inf or posterior shoulder mob   Special tests: (-) HK (-) painful arc (-) speed's (-) ER resist (-) belly press (-) lift off (-) neer (-) empty can (-) full can) (-) horn blowers (-) crank (-) biceps load II (-) apprehension relocation  TODAY'S TREATMENT:   10/13  UBE 2.5 min fwd and retro  13lb arnold press 4x6 13lb KB bottoms up holds 30s 3x OH  cable row with scap set 4x8 22.5lbs Offset KB push up 4x4 Assisted pull up 40% BW  3x5   9/29  UBE 2 min fwd and  retro  Floor press 3x8 20lbs  15lbs turkish prop 45 20lb SA punch 3x8 Bear position shoulder taps 4x10 Side plank 4x5 3s    9/10  UBE 2 min fwd and retro  Unilatreal cable row 20lbs 3x8  Shoulder external rotation elbow propped 3x8 5lb Prone shoulder extension, Y, T 3x8 5lbs  Table push up plus 3x8 Side plank 5s 3x5     Previous:     Program Notes 3 way endurance red band holds (open palms, stick up position, Frankenstein position)  10s 3x in all 3 positionsAvoid: bench press, shoulder/overhead pressing, hanging/pull ups, clean, jerks, snatches, impact activity, planking positions (unless static), shoulder 90/90 positions with load  Exercises - Standing Shoulder Diagonal Horizontal Abduction 60/120 Degrees with Resistance  - 1 x daily - 7 x weekly - 3 sets - 3 reps - Standing Single Arm Shoulder Abduction with Resistance  - 1 x daily - 7 x weekly - 3 sets - 5 reps - 5 hold    PATIENT EDUCATION: Education details: protection, diagnosis, prognosis, anatomy, exercise progression, DOMS expectations, muscle firing,  envelope of function, HEP, POC Person educated: Patient Education method: Explanation, Demonstration, Tactile cues, Verbal cues, and Handouts Education comprehension: verbalized understanding, returned demonstration, verbal cues required, tactile cues required, and needs further education     HOME EXERCISE PROGRAM:   Access Code: [email or web address] URL: [email or web address] Date: 02/05/2024 Prepared by: Dale Call    ASSESSMENT:   CLINICAL IMPRESSION:  Pt HEP progressed today without irritation to shoulder. Pt fatigues quikcly when going OH but there is no apprehension or feelings of GHJ instability. Pt advised she may start all static lifts in the gym but to avoid throwing, medball slams, and olympic style lifts. Plan to continue with L GHJ stability. Consider progression of OH pressing and bottoms up exercse at next. Pt would benefit from continued  skilled therapy in order to reach goals and maximize functional L UE strength and ROM for full return to PLOF, exercise, and caregiver duties.   Eval:  Patient is a 38 y.o. female who was seen today for physical therapy evaluation and treatment s/p suspected L posterior shoulder subluxation. Pt has strength deficits and pain after recent injury. Pt is most limited by strength and stability of the L shoulder as the cuff and deltoid complex are very sensitize and irritable with strength testing. Clinical testing does not suggest internal derangement at this time. Pt advised on safety with gym exercise and is able to start gradual return to exercise with precautions specified above. Pt gave verbal understanding to precautions and instructions. Pt pain is moderately sensitive at this time. Plan to continue with shoulder stability as tolerated keeping posterior dislocation precautions in mind. Pt would benefit from continued skilled therapy in order to reach goals and maximize functional L UE strength and ROM for full return to PLOF, exercise, and caregiver duties.      OBJECTIVE IMPAIRMENTS decreased activity tolerance, decreased ROM, decreased strength, increased muscle spasms, impaired UE functional use, improper body mechanics, postural dysfunction, and pain.    ACTIVITY LIMITATIONS cleaning, lifting, bending, carry, dressing, bathing, feeding, community activity, meal prep, laundry, and yard work.    PERSONAL FACTORS 1-2 comorbidities: are affecting  patient's functional outcome.     REHAB POTENTIAL: Good   CLINICAL DECISION MAKING: Stable/uncomplicated   EVALUATION COMPLEXITY: Low     GOALS:     SHORT TERM GOALS: Target date: 03/18/2024       Pt will become independent with HEP in order to demonstrate synthesis of PT education.   Goal status: INITIAL   2.  Pt will be able to demonstrate full APROM without pain  in order to demonstrate functional improvement in UE for progression to next  phase of protocol.    Goal status: INITIAL   3.  Pt will report at least 2 pt reduction on NPRS scale for pain in order to demonstrate functional improvement with household activity, self care, and ADL.    Goal status: INITIAL     Goal status: INITIAL     LONG TERM GOALS: Target date: 05/05/2024          Pt  will become independent with final HEP in order to demonstrate synthesis of PT education.   Goal status: INITIAL   2.  Pt will be able to reach [facility] and carry/hold >10lbs in order to demonstrate functional improvement in L UE strength for return to normalized ADL and exercise.    Goal status: INITIAL   3. Pt will be able to demonstrate at least 90% HHD testing of L vs R shoulder in order to demonstrate functional improvement in UE function for return to normalized function and strength for exercise and caregiver duties.      Goal status: INITIAL   4. Pt will have an at least 9 pt improvement in UEFS measure in order to demonstrate MCID improvement in daily function.    Goal status: INITIAL      PLAN: PT FREQUENCY: 1-2x/week   PT DURATION: 12 weeks   PLANNED INTERVENTIONS: Therapeutic exercises, Therapeutic activity, Neuromuscular re-education, Patient/Family education, Joint mobilization, Dry Needling, Spinal mobilization, Cryotherapy, Moist heat, Taping, and Manual therapy, Re-evaluation   PLAN FOR NEXT SESSION:  resistance as tolerated of shoulder review precautions, start stability and cuff exercise, start gym exercise re-intro in setting of posterior sublux (pt would like to work on return to gym exercise modificaitons)    Dale Call, PT 03/16/2024, 10:19 AM

## 2024-03-19 ENCOUNTER — Other Ambulatory Visit: Payer: Self-pay

## 2024-03-19 ENCOUNTER — Other Ambulatory Visit (HOSPITAL_BASED_OUTPATIENT_CLINIC_OR_DEPARTMENT_OTHER): Payer: Self-pay

## 2024-03-19 ENCOUNTER — Other Ambulatory Visit (HOSPITAL_COMMUNITY): Payer: Self-pay

## 2024-03-20 ENCOUNTER — Other Ambulatory Visit: Payer: Self-pay

## 2024-04-02 ENCOUNTER — Other Ambulatory Visit (HOSPITAL_BASED_OUTPATIENT_CLINIC_OR_DEPARTMENT_OTHER): Payer: Self-pay

## 2024-04-08 ENCOUNTER — Encounter (HOSPITAL_BASED_OUTPATIENT_CLINIC_OR_DEPARTMENT_OTHER): Payer: Self-pay | Admitting: Physical Therapy

## 2024-04-08 ENCOUNTER — Ambulatory Visit (HOSPITAL_BASED_OUTPATIENT_CLINIC_OR_DEPARTMENT_OTHER): Attending: Orthopedic Surgery | Admitting: Physical Therapy

## 2024-04-08 ENCOUNTER — Other Ambulatory Visit (HOSPITAL_BASED_OUTPATIENT_CLINIC_OR_DEPARTMENT_OTHER): Payer: Self-pay

## 2024-04-08 DIAGNOSIS — M6281 Muscle weakness (generalized): Secondary | ICD-10-CM | POA: Insufficient documentation

## 2024-04-08 DIAGNOSIS — M25511 Pain in right shoulder: Secondary | ICD-10-CM | POA: Insufficient documentation

## 2024-04-08 MED ORDER — FLUZONE 0.5 ML IM SUSY
0.5000 mL | PREFILLED_SYRINGE | Freq: Once | INTRAMUSCULAR | 0 refills | Status: AC
  Filled 2024-04-08: qty 0.5, 1d supply, fill #0

## 2024-04-08 NOTE — Therapy (Unsigned)
 OUTPATIENT PHYSICAL THERAPY SHOULDER EVALUATION   Patient Name: Pamela Harper MRN: [id number] DOB:15-Oct-1985, 38 y.o., female Today's Date: 04/08/2024  END OF SESSION:  PT End of Session - 04/08/24 1025     Visit Number 5    Number of Visits 19    Date for Recertification  05/05/24    Authorization Type [place]    PT Start Time 1019    PT Stop Time 1100    PT Time Calculation (min) 41 min    Activity Tolerance Patient tolerated treatment well;Patient limited by pain    Behavior During Therapy [facility] for tasks assessed/performed             Past Medical History:  Diagnosis Date   Depression    Seasonal allergies    Past Surgical History:  Procedure Laterality Date   CESAREAN SECTION  2019   CESAREAN SECTION N/A 03/20/2021   Procedure: CESAREAN SECTION;  Surgeon: Delana Ted Morrison, DO;  Location: [facility] LD ORS;  Service: Obstetrics;  Laterality: N/A;  request RNFA or 2nd scrub tech   CHOLECYSTECTOMY  2008   PERINEAL LACERATION REPAIR     There are no active problems to display for this patient.   PCP: Job Lukes, PA   REFERRING PROVIDER:  Eudora Lonni RAMAN, PA-C     REFERRING DIAG: [phone] (ICD-10-CM) - Anterior subluxation of left humerus, initial encounter   THERAPY DIAG:  No diagnosis found.  Rationale for Evaluation and Treatment: Rehabilitation  ONSET DATE: 01/18/24 Date Injury   SUBJECTIVE:                                                                                                                                                                                      SUBJECTIVE STATEMENT:  The patient was putting her baby on their bed and felt a slight subluxation. She has otherwise done well.   Eval:  Pt reports this happened at the gym doing alternating side planks. Denies history of subluxations. Pt reports feeling the shoulder give out.  Felt the shoulder shift and go back into place. Did not have to reduce the shoulder  herself. She immediately felt pain and instability with the left shoulder.. She states that the left shoulder still has pain with movement such as reaching and overhead movements. Denies NT. Pt does have feelings of weakness and clicking (non painful). Did have catching but no longer there. Pain feels deep in shoulder. Stomach sleeper, has started sleeping on her back. Denies night pain.  Denies bruising or swelling. Shoulder feels sore and easily fatigued and tired. Pt requires lifting with her 3 young children.  Pt works out 3x/week with  small group personal training. Uses exercise to manage her anxiety and is concerned about ability to exercise.   Hand dominance: Right  PERTINENT HISTORY: N/A  PAIN:  Are you having pain? No: NPRS scale: 0/10 Pain location: anterior and lateral,  Pain description: sharp pain, soreness into the bicep, deltoid was sore/stiff Aggravating factors: doing bra (lacks range), lifting son, repeated motions Relieving factors: ice, naproxen   PRECAUTIONS: Shoulder  RED FLAGS: None   WEIGHT BEARING RESTRICTIONS: No  FALLS:  Has patient fallen in last 6 months? 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  DIAGNOSTIC FINDINGS:  N/A  PATIENT SURVEYS :    Extreme difficulty/unable (0), Quite a bit of difficulty (1), Moderate difficulty (2), Little difficulty (3), No difficulty (4) Survey date:  9/3  Any of your usual work, household or school activities 3  2. Your usual hobbies, recreational/sport activities 2   3. Lifting a bag of groceries to waist level 3   4. Lifting a bag of groceries above your head 3  5. Grooming your hair 3  6. Pushing up on your hands (I.e. from bathtub  or chair) 4  7. Preparing food (I.e. peeling/cutting) 4  8. Driving  4  9. Vacuuming, sweeping, or raking 3  10. Dressing  3  11. Doing up buttons 4  12. Using tools/appliances 4  13. Opening doors 3  14. Cleaning  4  15. Tying or lacing shoes 4  16. Sleeping  3  17. Laundering clothes (I.e. washing, ironing, folding) 3  18. Opening a jar 4  19. Throwing a ball 4  20. Carrying a small suitcase with your affected limb.  3  Score total:  68/80      COGNITION: Overall cognitive status: Within functional limits for tasks assessed                                     SENSATION: WFL   POSTURE: WFL   UPPER EXTREMITY ROM:     WNL for A/PROM in all planes Pain recreated with clinician overpressure Hypermobility noted, especially with ER on R   UPPER EXTREMITY MMT:   MMT Right 9/3  Left 9/3  Painful throughout  Shoulder flexion 44.6 38.8  Shoulder extension 47.4 40.8  Shoulder abduction 40.1 29.6  Shoulder adduction    Shoulder internal rotation    Shoulder external rotation 26.2 20.1  Elbow flexion    Elbow extension    Wrist flexion    Wrist extension    Wrist ulnar deviation    Wrist radial deviation    Wrist pronation    Wrist supination    (Blank rows = not tested)      PALPATION: TTP of posterior shoulder, delt, biceps, and UT  No laxity with inf or posterior shoulder mob   Special tests: (-) HK (-) painful arc (-) speed's (-) ER resist (-) belly press (-) lift off (-) neer (-) empty can (-) full can) (-) horn blowers (-) crank (-) biceps load II (-) apprehension relocation  TODAY'S TREATMENT:     11/5  There-ex:  UBE 2.5 min fwd and retro Side lying ER 3x12  3lbs 2x12  T-band IR green 3x12 RPE of 4   Reviewed and updated HEP  Neuro-re-ed:  Supine ABC 3lb and 4 lbs   Wall clock red 11/9/6 5x  Reviewed how to set bands in the door .  Ball v wall 4 way 2x 20 each direction    10/13  UBE 2.5 min fwd and retro  13lb arnold  press 4x6 13lb KB bottoms up holds 30s 3x OH  cable row with scap set 4x8 22.5lbs Offset KB push up 4x4 Assisted pull up 40% BW  3x5   9/29  UBE 2 min fwd and retro  Floor press 3x8 20lbs  15lbs turkish prop 45 20lb SA punch 3x8 Bear position shoulder taps 4x10 Side plank 4x5 3s    9/10  UBE 2 min fwd and retro  Unilatreal cable row 20lbs 3x8  Shoulder external rotation elbow propped 3x8 5lb Prone shoulder extension, Y, T 3x8 5lbs  Table push up plus 3x8 Side plank 5s 3x5     Previous:     Program Notes 3 way endurance red band holds (open palms, stick up position, Frankenstein position)  10s 3x in all 3 positionsAvoid: bench press, shoulder/overhead pressing, hanging/pull ups, clean, jerks, snatches, impact activity, planking positions (unless static), shoulder 90/90 positions with load  Exercises - Standing Shoulder Diagonal Horizontal Abduction 60/120 Degrees with Resistance  - 1 x daily - 7 x weekly - 3 sets - 3 reps - Standing Single Arm Shoulder Abduction with Resistance  - 1 x daily - 7 x weekly - 3 sets - 5 reps - 5 hold    PATIENT EDUCATION: Education details: protection, diagnosis, prognosis, anatomy, exercise progression, DOMS expectations, muscle firing,  envelope of function, HEP, POC Person educated: Patient Education method: Explanation, Demonstration, Tactile cues, Verbal cues, and Handouts Education comprehension: verbalized understanding, returned demonstration, verbal cues required, tactile cues required, and needs further education     HOME EXERCISE PROGRAM:   Access Code: [email or web address] URL: [email or web address] Date: 02/05/2024 Prepared by: Dale Call    ASSESSMENT:   CLINICAL IMPRESSION:  Therapy reviewed small muscle stabilization and endurance exercises for her HEP. She was advised on days she doen't go to the gym to work on those. She tolerated well.She had fatigue by the end. We reviewed how these will help potential for  subluxation.  Eval:  Patient is a 38 y.o. female who was seen today for physical therapy evaluation and treatment s/p suspected L posterior shoulder subluxation. Pt has strength deficits and pain after recent injury. Pt is most limited by strength and stability of the L shoulder as the cuff and deltoid complex are very sensitize and irritable with strength testing. Clinical testing does not suggest internal derangement at this time. Pt advised on safety with gym exercise and is able to start gradual return to exercise with precautions specified above. Pt gave verbal understanding to precautions and instructions. Pt pain is moderately sensitive at this time. Plan to continue with shoulder stability as tolerated keeping posterior dislocation precautions in mind. Pt would benefit from continued skilled therapy in order to reach goals and maximize functional L UE strength and ROM for full return to PLOF, exercise, and caregiver duties.      OBJECTIVE IMPAIRMENTS decreased activity tolerance, decreased ROM, decreased strength, increased muscle spasms, impaired UE functional use, improper body mechanics, postural dysfunction, and pain.    ACTIVITY LIMITATIONS cleaning, lifting, bending, carry, dressing, bathing, feeding, community activity, meal prep, laundry, and yard work.  PERSONAL FACTORS 1-2 comorbidities: are affecting patient's functional outcome.     REHAB POTENTIAL: Good   CLINICAL DECISION MAKING: Stable/uncomplicated   EVALUATION COMPLEXITY: Low     GOALS:     SHORT TERM GOALS: Target date: 03/18/2024       Pt will become independent with HEP in order to demonstrate synthesis of PT education.   Goal status: INITIAL   2.  Pt will be able to demonstrate full APROM without pain  in order to demonstrate functional improvement in UE for progression to next phase of protocol.    Goal status: INITIAL   3.  Pt will report at least 2 pt reduction on NPRS scale for pain in order to  demonstrate functional improvement with household activity, self care, and ADL.    Goal status: INITIAL     Goal status: INITIAL     LONG TERM GOALS: Target date: 05/05/2024          Pt  will become independent with final HEP in order to demonstrate synthesis of PT education.   Goal status: INITIAL   2.  Pt will be able to reach [facility] and carry/hold >10lbs in order to demonstrate functional improvement in L UE strength for return to normalized ADL and exercise.    Goal status: INITIAL   3. Pt will be able to demonstrate at least 90% HHD testing of L vs R shoulder in order to demonstrate functional improvement in UE function for return to normalized function and strength for exercise and caregiver duties.      Goal status: INITIAL   4. Pt will have an at least 9 pt improvement in UEFS measure in order to demonstrate MCID improvement in daily function.    Goal status: INITIAL      PLAN: PT FREQUENCY: 1-2x/week   PT DURATION: 12 weeks   PLANNED INTERVENTIONS: Therapeutic exercises, Therapeutic activity, Neuromuscular re-education, Patient/Family education, Joint mobilization, Dry Needling, Spinal mobilization, Cryotherapy, Moist heat, Taping, and Manual therapy, Re-evaluation   PLAN FOR NEXT SESSION:  resistance as tolerated of shoulder review precautions, start stability and cuff exercise, start gym exercise re-intro in setting of posterior sublux (pt would like to work on return to gym exercise modificaitons)    Alm JINNY Don, PT 04/08/2024, 10:25 AM

## 2024-04-09 ENCOUNTER — Encounter (HOSPITAL_BASED_OUTPATIENT_CLINIC_OR_DEPARTMENT_OTHER): Payer: Self-pay | Admitting: Physical Therapy

## 2024-04-22 ENCOUNTER — Ambulatory Visit (HOSPITAL_BASED_OUTPATIENT_CLINIC_OR_DEPARTMENT_OTHER): Admitting: Physical Therapy

## 2024-04-22 ENCOUNTER — Encounter (HOSPITAL_BASED_OUTPATIENT_CLINIC_OR_DEPARTMENT_OTHER): Payer: Self-pay | Admitting: Physical Therapy

## 2024-04-22 DIAGNOSIS — M6281 Muscle weakness (generalized): Secondary | ICD-10-CM | POA: Diagnosis not present

## 2024-04-22 DIAGNOSIS — M25511 Pain in right shoulder: Secondary | ICD-10-CM

## 2024-04-22 NOTE — Therapy (Signed)
 OUTPATIENT PHYSICAL THERAPY SHOULDER EVALUATION   Patient Name: Pamela Harper MRN: [id number] DOB:12-27-1985, 38 y.o., female Today's Date: 04/22/2024  END OF SESSION:  PT End of Session - 04/22/24 0914     Visit Number 6    Number of Visits 19    Date for Recertification  05/05/24    Authorization Type     PT Start Time 0845    PT Stop Time 0926    PT Time Calculation (min) 41 min    Activity Tolerance Patient tolerated treatment well;Patient limited by pain    Behavior During Therapy [facility] for tasks assessed/performed              Past Medical History:  Diagnosis Date   Depression    Seasonal allergies    Past Surgical History:  Procedure Laterality Date   CESAREAN SECTION  2019   CESAREAN SECTION N/A 03/20/2021   Procedure: CESAREAN SECTION;  Surgeon: Delana Ted Morrison, DO;  Location: [facility] LD ORS;  Service: Obstetrics;  Laterality: N/A;  request RNFA or 2nd scrub tech   CHOLECYSTECTOMY  2008   PERINEAL LACERATION REPAIR     There are no active problems to display for this patient.   PCP: Job Lukes, PA   REFERRING PROVIDER:  Eudora Lonni RAMAN, PA-C     REFERRING DIAG: [phone] (ICD-10-CM) - Anterior subluxation of left humerus, initial encounter   THERAPY DIAG:  Right shoulder pain, unspecified chronicity  Muscle weakness (generalized)  Rationale for Evaluation and Treatment: Rehabilitation  ONSET DATE: 01/18/24 Date Injury   SUBJECTIVE:                                                                                                                                                                                      SUBJECTIVE STATEMENT:  Pt has not had pain or subluxations since last. Has been doing more pressing in the gym with trainers. Her son laying on that L shoulder is sore but not painful.   Eval:  Pt reports this happened at the gym doing alternating side planks. Denies history of subluxations. Pt reports  feeling the shoulder give out.  Felt the shoulder shift and go back into place. Did not have to reduce the shoulder herself. She immediately felt pain and instability with the left shoulder.. She states that the left shoulder still has pain with movement such as reaching and overhead movements. Denies NT. Pt does have feelings of weakness and clicking (non painful). Did have catching but no longer there. Pain feels deep in shoulder. Stomach sleeper, has started sleeping on her back. Denies night pain.  Denies bruising or swelling. Shoulder feels sore  and easily fatigued and tired. Pt requires lifting with her 3 young children.  Pt works out 3x/week with small group personal training. Uses exercise to manage her anxiety and is concerned about ability to exercise.   Hand dominance: Right  PERTINENT HISTORY: N/A  PAIN:  Are you having pain? No: NPRS scale: 0/10 Pain location: anterior and lateral,  Pain description: sharp pain, soreness into the bicep, deltoid was sore/stiff Aggravating factors: doing bra (lacks range), lifting son, repeated motions Relieving factors: ice, naproxen   PRECAUTIONS: Shoulder  RED FLAGS: None   WEIGHT BEARING RESTRICTIONS: No  FALLS:  Has patient fallen in last 6 months? 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  DIAGNOSTIC FINDINGS:  N/A  PATIENT SURVEYS :    Extreme difficulty/unable (0), Quite a bit of difficulty (1), Moderate difficulty (2), Little difficulty (3), No difficulty (4) Survey date:  9/3  Any of your usual work, household or school activities 3  2. Your usual hobbies, recreational/sport activities 2   3. Lifting a bag of groceries to waist level 3   4.  Lifting a bag of groceries above your head 3  5. Grooming your hair 3  6. Pushing up on your hands (I.e. from bathtub or chair) 4  7. Preparing food (I.e. peeling/cutting) 4  8. Driving  4  9. Vacuuming, sweeping, or raking 3  10. Dressing  3  11. Doing up buttons 4  12. Using tools/appliances 4  13. Opening doors 3  14. Cleaning  4  15. Tying or lacing shoes 4  16. Sleeping  3  17. Laundering clothes (I.e. washing, ironing, folding) 3  18. Opening a jar 4  19. Throwing a ball 4  20. Carrying a small suitcase with your affected limb.  3  Score total:  68/80      COGNITION: Overall cognitive status: Within functional limits for tasks assessed                                     SENSATION: WFL   POSTURE: WFL   UPPER EXTREMITY ROM:     WNL for A/PROM in all planes Pain recreated with clinician overpressure Hypermobility noted, especially with ER on R   UPPER EXTREMITY MMT:   MMT Right 9/3  Left 9/3  Painful throughout  Shoulder flexion 44.6 38.8  Shoulder extension 47.4 40.8  Shoulder abduction 40.1 29.6  Shoulder adduction    Shoulder internal rotation    Shoulder external rotation 26.2 20.1  Elbow flexion    Elbow extension    Wrist flexion    Wrist extension    Wrist ulnar deviation    Wrist radial deviation    Wrist pronation    Wrist supination    (Blank rows = not tested)      PALPATION: TTP of posterior shoulder, delt, biceps, and UT  No laxity with inf or posterior shoulder mob   Special tests: (-) HK (-) painful arc (-) speed's (-) ER resist (-) belly press (-) lift off (-) neer (-) empty can (-) full can) (-) horn blowers (-) crank (-) biceps load II (-) apprehension relocation  TODAY'S  TREATMENT:   11/19  Long lever shoulder flexion 3x8 5lb cable 6lb shoulder ABD 3x8 (full arc)  Kneeling catch land (small hip hinge) 3x8 Side plank on knees with rotation 3x8  Instability OH holds 10lbs 3x hallway   11/5  There-ex:  UBE  2.5 min fwd and retro Side lying ER 3x12  3lbs 2x12  T-band IR green 3x12 RPE of 4   Reviewed and updated HEP  Neuro-re-ed:  Supine ABC 3lb and 4 lbs   Wall clock red 11/9/6 5x  Reviewed how to set bands in the door .  Ball v wall 4 way 2x 20 each direction    10/13  UBE 2.5 min fwd and retro  13lb arnold press 4x6 13lb KB bottoms up holds 30s 3x OH  cable row with scap set 4x8 22.5lbs Offset KB push up 4x4 Assisted pull up 40% BW  3x5   9/29  UBE 2 min fwd and retro  Floor press 3x8 20lbs  15lbs turkish prop 45 20lb SA punch 3x8 Bear position shoulder taps 4x10 Side plank 4x5 3s    9/10  UBE 2 min fwd and retro  Unilatreal cable row 20lbs 3x8  Shoulder external rotation elbow propped 3x8 5lb Prone shoulder extension, Y, T 3x8 5lbs  Table push up plus 3x8 Side plank 5s 3x5     Previous:     Program Notes 3 way endurance red band holds (open palms, stick up position, Frankenstein position)  10s 3x in all 3 positionsAvoid: bench press, shoulder/overhead pressing, hanging/pull ups, clean, jerks, snatches, impact activity, planking positions (unless static), shoulder 90/90 positions with load  Exercises - Standing Shoulder Diagonal Horizontal Abduction 60/120 Degrees with Resistance  - 1 x daily - 7 x weekly - 3 sets - 3 reps - Standing Single Arm Shoulder Abduction with Resistance  - 1 x daily - 7 x weekly - 3 sets - 5 reps - 5 hold    PATIENT EDUCATION: Education details: protection, diagnosis, prognosis, anatomy, exercise progression, DOMS expectations, muscle firing,  envelope of function, HEP, POC Person educated: Patient Education method: Explanation, Demonstration, Tactile cues, Verbal cues, and Handouts Education comprehension: verbalized understanding, returned demonstration, verbal cues required, tactile cues required, and needs further education     HOME EXERCISE PROGRAM:   Access Code: [email or web address] URL:  [email or web address] Date: 02/05/2024 Prepared by: Dale Call    ASSESSMENT:   CLINICAL IMPRESSION:  Pt able to return to stability and OH exercise with external pertrubation without pain or instability. Pt able to start long lever position strengthening with notable weakness and endurance deficits. HEP trimmed to aid in compliance. Pt able to start impact landings without feelings of sublux and pain. Plan to d/c at next session if no remaining deficits or adverse response to exercise in meantime. Pt progressing well with goals at this time.    Eval:  Patient is a 38 y.o. female who was seen today for physical therapy evaluation and treatment s/p suspected L posterior shoulder subluxation. Pt has strength deficits and pain after recent injury. Pt is most limited by strength and stability of the L shoulder as the cuff and deltoid complex are very sensitize and irritable with strength testing. Clinical testing does not suggest internal derangement at this time. Pt advised on safety with gym exercise and is able to start gradual return to exercise with precautions specified above. Pt gave verbal understanding to precautions and instructions. Pt pain is moderately sensitive at this time. Plan to continue with  shoulder stability as tolerated keeping posterior dislocation precautions in mind. Pt would benefit from continued skilled therapy in order to reach goals and maximize functional L UE strength and ROM for full return to PLOF, exercise, and caregiver duties.      OBJECTIVE IMPAIRMENTS decreased activity tolerance, decreased ROM, decreased strength, increased muscle spasms, impaired UE functional use, improper body mechanics, postural dysfunction, and pain.    ACTIVITY LIMITATIONS cleaning, lifting, bending, carry, dressing, bathing, feeding, community activity, meal prep, laundry, and yard work.    PERSONAL FACTORS 1-2 comorbidities: are affecting patient's functional outcome.      REHAB POTENTIAL: Good   CLINICAL DECISION MAKING: Stable/uncomplicated   EVALUATION COMPLEXITY: Low     GOALS:     SHORT TERM GOALS: Target date: 03/18/2024       Pt will become independent with HEP in order to demonstrate synthesis of PT education.   Goal status: MET   2.  Pt will be able to demonstrate full APROM without pain  in order to demonstrate functional improvement in UE for progression to next phase of protocol.    Goal status: MET   3.  Pt will report at least 2 pt reduction on NPRS scale for pain in order to demonstrate functional improvement with household activity, self care, and ADL.    Goal status: MET     Goal status: INITIAL     LONG TERM GOALS: Target date: 05/05/2024          Pt  will become independent with final HEP in order to demonstrate synthesis of PT education.   Goal status: ongoing   2.  Pt will be able to reach [facility] and carry/hold >10lbs in order to demonstrate functional improvement in L UE strength for return to normalized ADL and exercise.    Goal status: MET   3. Pt will be able to demonstrate at least 90% HHD testing of L vs R shoulder in order to demonstrate functional improvement in UE function for return to normalized function and strength for exercise and caregiver duties.      Goal status: ongoing   4. Pt will have an at least 9 pt improvement in UEFS measure in order to demonstrate MCID improvement in daily function.    Goal status: ongoing      PLAN: PT FREQUENCY: 1-2x/week   PT DURATION: 12 weeks   PLANNED INTERVENTIONS: Therapeutic exercises, Therapeutic activity, Neuromuscular re-education, Patient/Family education, Joint mobilization, Dry Needling, Spinal mobilization, Cryotherapy, Moist heat, Taping, and Manual therapy, Re-evaluation   PLAN FOR NEXT SESSION:  resistance as tolerated of shoulder review precautions, start stability and cuff exercise, start gym exercise re-intro in setting of posterior sublux (pt  would like to work on return to gym exercise modificaitons)    Dale Call, PT 04/22/2024, 9:29 AM

## 2024-05-04 ENCOUNTER — Ambulatory Visit (HOSPITAL_BASED_OUTPATIENT_CLINIC_OR_DEPARTMENT_OTHER): Attending: Orthopedic Surgery | Admitting: Physical Therapy

## 2024-05-04 ENCOUNTER — Encounter (HOSPITAL_BASED_OUTPATIENT_CLINIC_OR_DEPARTMENT_OTHER): Payer: Self-pay | Admitting: Physical Therapy

## 2024-05-04 DIAGNOSIS — M6281 Muscle weakness (generalized): Secondary | ICD-10-CM | POA: Diagnosis present

## 2024-05-04 DIAGNOSIS — M25511 Pain in right shoulder: Secondary | ICD-10-CM | POA: Insufficient documentation

## 2024-05-04 NOTE — Therapy (Signed)
 OUTPATIENT PHYSICAL THERAPY SHOULDER EVALUATION  PHYSICAL THERAPY DISCHARGE SUMMARY  Visits from Start of Care: 7  Plan: Patient agrees to discharge.  Patient goals were met. Patient is being discharged due to meeting the stated rehab goals.         Patient Name: Pamela Harper MRN: [id number] DOB:1986/05/25, 38 y.o., female Today's Date: 05/04/2024  END OF SESSION:  PT End of Session - 05/04/24 1007     Visit Number 7    Number of Visits 19    Date for Recertification  05/05/24    Authorization Type [employer]    PT Start Time 0935    PT Stop Time 1005    PT Time Calculation (min) 30 min    Activity Tolerance Patient tolerated treatment well;Patient limited by pain    Behavior During Therapy [facility] for tasks assessed/performed               Past Medical History:  Diagnosis Date   Depression    Seasonal allergies    Past Surgical History:  Procedure Laterality Date   CESAREAN SECTION  2019   CESAREAN SECTION N/A 03/20/2021   Procedure: CESAREAN SECTION;  Surgeon: Delana Ted Morrison, DO;  Location: [facility] LD ORS;  Service: Obstetrics;  Laterality: N/A;  request RNFA or 2nd scrub tech   CHOLECYSTECTOMY  2008   PERINEAL LACERATION REPAIR     There are no active problems to display for this patient.   PCP: Job Lukes, PA   REFERRING PROVIDER:  Eudora Lonni RAMAN, PA-C     REFERRING DIAG: [phone] (ICD-10-CM) - Anterior subluxation of left humerus, initial encounter   THERAPY DIAG:  Right shoulder pain, unspecified chronicity  Muscle weakness (generalized)  Rationale for Evaluation and Treatment: Rehabilitation  ONSET DATE: 01/18/24 Date Injury   SUBJECTIVE:                                                                                                                                                                                      SUBJECTIVE STATEMENT:  Pt reports no issues since last session. Pt reports the shoulder is not quit  100% but feels she is about 80%. She feels that strength and amount of weight lifted is still somewhat limited. Denies episodes of instability in the gym.   Eval:  Pt reports this happened at the gym doing alternating side planks. Denies history of subluxations. Pt reports feeling the shoulder give out.  Felt the shoulder shift and go back into place. Did not have to reduce the shoulder herself. She immediately felt pain and instability with the left shoulder.. She states that the left shoulder still has pain  with movement such as reaching and overhead movements. Denies NT. Pt does have feelings of weakness and clicking (non painful). Did have catching but no longer there. Pain feels deep in shoulder. Stomach sleeper, has started sleeping on her back. Denies night pain.  Denies bruising or swelling. Shoulder feels sore and easily fatigued and tired. Pt requires lifting with her 3 young children.  Pt works out 3x/week with small group personal training. Uses exercise to manage her anxiety and is concerned about ability to exercise.   Hand dominance: Right  PERTINENT HISTORY: N/A  PAIN:  Are you having pain? No: NPRS scale: 0/10 Pain location: anterior and lateral,  Pain description: sharp pain, soreness into the bicep, deltoid was sore/stiff Aggravating factors: doing bra (lacks range), lifting son, repeated motions Relieving factors: ice, naproxen   PRECAUTIONS: Shoulder  RED FLAGS: None   WEIGHT BEARING RESTRICTIONS: No  FALLS:  Has patient fallen in last 6 months? 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  DIAGNOSTIC FINDINGS:  N/A  PATIENT SURVEYS :    Extreme difficulty/unable (0), Quite a bit  of difficulty (1), Moderate difficulty (2), Little difficulty (3), No difficulty (4) Survey date:  9/3 12/1  Any of your usual work, household or school activities 3 4  2. Your usual hobbies, recreational/sport activities 2 4   3. Lifting a bag of groceries to waist level 3 4   4. Lifting a bag of groceries above your head 3 4  5. Grooming your hair 3 4  6. Pushing up on your hands (I.e. from bathtub or chair) 4 4  7. Preparing food (I.e. peeling/cutting) 4 4  8. Driving  4 4  9. Vacuuming, sweeping, or raking 3 4  10. Dressing  3 4  11. Doing up buttons 4 4  12. Using tools/appliances 4 4  13. Opening doors 3 4  14. Cleaning  4 4  15. Tying or lacing shoes 4 4  16. Sleeping  3 4  17. Laundering clothes (I.e. washing, ironing, folding) 3 4  18. Opening a jar 4 4  19. Throwing a ball 4 4  20. Carrying a small suitcase with your affected limb.  3 4  Score total:  68/80 80/80      COGNITION: Overall cognitive status: Within functional limits for tasks assessed                                     SENSATION: WFL   POSTURE: WFL   UPPER EXTREMITY ROM:     WNL for A/PROM in all planes Pain recreated with clinician overpressure Hypermobility noted, especially with ER on R   UPPER EXTREMITY MMT:   MMT Right 9/3 R 12/1  Left 9/3  Painful throughout L 12/1  Shoulder flexion 44.6 39.8 lbs 38.8 45.3  Shoulder extension 47.4 51.4 40.8 50.4   Shoulder abduction 40.1 42.9 29.6 45.9  Shoulder adduction      Shoulder internal rotation      Shoulder external rotation 26.2 29.2 20.1 28.3  Elbow flexion      Elbow extension      Wrist flexion  Wrist extension      Wrist ulnar deviation      Wrist radial deviation      Wrist pronation      Wrist supination      (Blank rows = not tested)       TODAY'S TREATMENT:   12/1  HEP exercise instructions, HEP review, progression/regression, exercise tapering, self management of shoulder pain, recovery post workout, outside  factors influencing pain and shoulder function  11/19  Long lever shoulder flexion 3x8 5lb cable 6lb shoulder ABD 3x8 (full arc)  Kneeling catch land (small hip hinge) 3x8 Side plank on knees with rotation 3x8  Instability OH holds 10lbs 3x hallway   11/5  There-ex:  UBE 2.5 min fwd and retro Side lying ER 3x12  3lbs 2x12  T-band IR green 3x12 RPE of 4   Reviewed and updated HEP  Neuro-re-ed:  Supine ABC 3lb and 4 lbs   Wall clock red 11/9/6 5x  Reviewed how to set bands in the door .  Ball v wall 4 way 2x 20 each direction    10/13  UBE 2.5 min fwd and retro  13lb arnold press 4x6 13lb KB bottoms up holds 30s 3x OH  cable row with scap set 4x8 22.5lbs Offset KB push up 4x4 Assisted pull up 40% BW  3x5   9/29  UBE 2 min fwd and retro  Floor press 3x8 20lbs  15lbs turkish prop 45 20lb SA punch 3x8 Bear position shoulder taps 4x10 Side plank 4x5 3s    9/10  UBE 2 min fwd and retro  Unilatreal cable row 20lbs 3x8  Shoulder external rotation elbow propped 3x8 5lb Prone shoulder extension, Y, T 3x8 5lbs  Table push up plus 3x8 Side plank 5s 3x5     Previous:     Program Notes 3 way endurance red band holds (open palms, stick up position, Frankenstein position)  10s 3x in all 3 positionsAvoid: bench press, shoulder/overhead pressing, hanging/pull ups, clean, jerks, snatches, impact activity, planking positions (unless static), shoulder 90/90 positions with load  Exercises - Standing Shoulder Diagonal Horizontal Abduction 60/120 Degrees with Resistance  - 1 x daily - 7 x weekly - 3 sets - 3 reps - Standing Single Arm Shoulder Abduction with Resistance  - 1 x daily - 7 x weekly - 3 sets - 5 reps - 5 hold    PATIENT EDUCATION: Education details: protection, diagnosis, prognosis, anatomy, exercise progression, DOMS expectations, muscle firing,  envelope of function, HEP, POC Person educated: Patient Education method: Explanation, Demonstration,  Tactile cues, Verbal cues, and Handouts Education comprehension: verbalized understanding, returned demonstration, verbal cues required, tactile cues required, and needs further education     HOME EXERCISE PROGRAM:   Access Code: [email or web address] URL: [email or web address] Date: 02/05/2024 Prepared by: Dale Call    ASSESSMENT:   CLINICAL IMPRESSION:  Pt has met all PT goals at this time and demonstrates normalized L shoulder function as shown above. Pt denies s/s of subluxations and reports no difficulty with ADL. Pt to continue with self guided exercise using HEP. DC this episode of care.   Eval:  Patient is a 38 y.o. female who was seen today for physical therapy evaluation and treatment s/p suspected L posterior shoulder subluxation. Pt has strength deficits and pain after recent injury. Pt is most limited by strength and stability of the L shoulder as the cuff and deltoid complex are very sensitize and irritable with strength testing. Clinical testing does not suggest internal  derangement at this time. Pt advised on safety with gym exercise and is able to start gradual return to exercise with precautions specified above. Pt gave verbal understanding to precautions and instructions. Pt pain is moderately sensitive at this time. Plan to continue with shoulder stability as tolerated keeping posterior dislocation precautions in mind. Pt would benefit from continued skilled therapy in order to reach goals and maximize functional L UE strength and ROM for full return to PLOF, exercise, and caregiver duties.      OBJECTIVE IMPAIRMENTS decreased activity tolerance, decreased ROM, decreased strength, increased muscle spasms, impaired UE functional use, improper body mechanics, postural dysfunction, and pain.    ACTIVITY LIMITATIONS cleaning, lifting, bending, carry, dressing, bathing, feeding, community activity, meal prep, laundry, and yard work.    PERSONAL FACTORS 1-2 comorbidities: are  affecting patient's functional outcome.     REHAB POTENTIAL: Good   CLINICAL DECISION MAKING: Stable/uncomplicated   EVALUATION COMPLEXITY: Low     GOALS:     SHORT TERM GOALS: Target date: 03/18/2024       Pt will become independent with HEP in order to demonstrate synthesis of PT education.   Goal status: MET   2.  Pt will be able to demonstrate full APROM without pain  in order to demonstrate functional improvement in UE for progression to next phase of protocol.    Goal status: MET   3.  Pt will report at least 2 pt reduction on NPRS scale for pain in order to demonstrate functional improvement with household activity, self care, and ADL.    Goal status: MET          LONG TERM GOALS: Target date: 05/05/2024          Pt  will become independent with final HEP in order to demonstrate synthesis of PT education.   Goal status: MET   2.  Pt will be able to reach [facility] and carry/hold >10lbs in order to demonstrate functional improvement in L UE strength for return to normalized ADL and exercise.    Goal status: MET   3. Pt will be able to demonstrate at least 90% HHD testing of L vs R shoulder in order to demonstrate functional improvement in UE function for return to normalized function and strength for exercise and caregiver duties.      Goal status: MET   4. Pt will have an at least 9 pt improvement in UEFS measure in order to demonstrate [facility] improvement in daily function.    Goal status: <MET      PLAN: PT FREQUENCY: 1-2x/week   PT DURATION: 12 weeks   PLANNED INTERVENTIONS: Therapeutic exercises, Therapeutic activity, Neuromuscular re-education, Patient/Family education, Joint mobilization, Dry Needling, Spinal mobilization, Cryotherapy, Moist heat, Taping, and Manual therapy, Re-evaluation      Dale Call, PT 05/04/2024, 10:10 AM

## 2024-06-10 ENCOUNTER — Other Ambulatory Visit (HOSPITAL_COMMUNITY): Payer: Self-pay

## 2024-07-08 ENCOUNTER — Other Ambulatory Visit: Payer: Self-pay | Admitting: Physician Assistant

## 2024-07-09 ENCOUNTER — Other Ambulatory Visit (HOSPITAL_BASED_OUTPATIENT_CLINIC_OR_DEPARTMENT_OTHER): Payer: Self-pay

## 2024-07-09 MED ORDER — SERTRALINE HCL 50 MG PO TABS
ORAL_TABLET | ORAL | 1 refills | Status: AC
  Filled 2024-07-09: qty 90, 90d supply, fill #0

## 2024-12-22 ENCOUNTER — Encounter: Admitting: Physician Assistant
